# Patient Record
Sex: Female | Born: 1945 | Race: White | Hispanic: No | Marital: Single
Health system: Southern US, Community
[De-identification: ages and names within clinical notes are randomized; demographics above are authoritative.]

---

## 2005-08-04 ENCOUNTER — Ambulatory Visit: Payer: Self-pay | Admitting: *Deleted

## 2006-05-05 ENCOUNTER — Ambulatory Visit: Payer: Self-pay

## 2007-03-25 ENCOUNTER — Ambulatory Visit: Payer: Self-pay | Admitting: *Deleted

## 2007-04-19 ENCOUNTER — Ambulatory Visit: Payer: Self-pay | Admitting: *Deleted

## 2008-03-13 ENCOUNTER — Ambulatory Visit: Payer: Self-pay | Admitting: *Deleted

## 2008-04-11 ENCOUNTER — Ambulatory Visit: Payer: Self-pay | Admitting: Family Medicine

## 2008-05-09 ENCOUNTER — Ambulatory Visit: Payer: Self-pay | Admitting: Oncology

## 2008-05-17 ENCOUNTER — Ambulatory Visit: Payer: Self-pay | Admitting: Oncology

## 2008-06-17 ENCOUNTER — Ambulatory Visit: Payer: Self-pay | Admitting: Oncology

## 2008-08-14 ENCOUNTER — Ambulatory Visit: Payer: Self-pay | Admitting: Oncology

## 2008-08-17 ENCOUNTER — Ambulatory Visit: Payer: Self-pay | Admitting: Oncology

## 2008-08-22 ENCOUNTER — Ambulatory Visit: Payer: Self-pay | Admitting: Oncology

## 2008-09-03 ENCOUNTER — Ambulatory Visit: Payer: Self-pay | Admitting: General Surgery

## 2008-09-06 ENCOUNTER — Ambulatory Visit: Payer: Self-pay | Admitting: General Surgery

## 2008-09-17 ENCOUNTER — Ambulatory Visit: Payer: Self-pay | Admitting: Oncology

## 2008-10-15 ENCOUNTER — Ambulatory Visit: Payer: Self-pay | Admitting: Oncology

## 2008-12-15 ENCOUNTER — Ambulatory Visit: Payer: Self-pay | Admitting: Oncology

## 2008-12-17 ENCOUNTER — Ambulatory Visit: Payer: Self-pay | Admitting: Oncology

## 2008-12-26 ENCOUNTER — Ambulatory Visit: Payer: Self-pay | Admitting: Oncology

## 2009-01-15 ENCOUNTER — Ambulatory Visit: Payer: Self-pay | Admitting: Oncology

## 2009-03-27 ENCOUNTER — Ambulatory Visit: Payer: Self-pay | Admitting: Oncology

## 2009-04-17 ENCOUNTER — Ambulatory Visit: Payer: Self-pay | Admitting: Oncology

## 2009-06-17 ENCOUNTER — Ambulatory Visit: Payer: Self-pay | Admitting: Oncology

## 2009-06-20 ENCOUNTER — Ambulatory Visit: Payer: Self-pay | Admitting: Oncology

## 2009-07-04 ENCOUNTER — Ambulatory Visit: Payer: Self-pay | Admitting: Oncology

## 2009-07-17 ENCOUNTER — Ambulatory Visit: Payer: Self-pay | Admitting: Oncology

## 2010-01-15 ENCOUNTER — Ambulatory Visit: Payer: Self-pay | Admitting: Oncology

## 2010-01-22 ENCOUNTER — Ambulatory Visit: Payer: Self-pay | Admitting: Oncology

## 2010-02-14 ENCOUNTER — Ambulatory Visit: Payer: Self-pay | Admitting: Oncology

## 2010-07-29 ENCOUNTER — Ambulatory Visit: Payer: Self-pay | Admitting: Oncology

## 2010-07-31 ENCOUNTER — Ambulatory Visit: Payer: Self-pay | Admitting: Oncology

## 2010-08-17 ENCOUNTER — Ambulatory Visit: Payer: Self-pay | Admitting: Oncology

## 2010-10-02 ENCOUNTER — Ambulatory Visit: Payer: Self-pay | Admitting: Oncology

## 2010-10-09 ENCOUNTER — Ambulatory Visit: Payer: Self-pay | Admitting: Vascular Surgery

## 2010-10-16 ENCOUNTER — Ambulatory Visit: Payer: Self-pay | Admitting: Oncology

## 2010-11-16 ENCOUNTER — Ambulatory Visit: Payer: Self-pay | Admitting: Oncology

## 2010-12-16 ENCOUNTER — Ambulatory Visit: Payer: Self-pay | Admitting: Oncology

## 2011-01-16 ENCOUNTER — Ambulatory Visit: Payer: Self-pay | Admitting: Oncology

## 2011-02-15 ENCOUNTER — Ambulatory Visit: Payer: Self-pay | Admitting: Oncology

## 2011-03-18 ENCOUNTER — Ambulatory Visit: Payer: Self-pay | Admitting: Oncology

## 2011-04-18 ENCOUNTER — Ambulatory Visit: Payer: Self-pay | Admitting: Oncology

## 2011-05-18 ENCOUNTER — Ambulatory Visit: Payer: Self-pay | Admitting: Oncology

## 2011-05-18 ENCOUNTER — Ambulatory Visit: Payer: Self-pay

## 2011-06-19 ENCOUNTER — Ambulatory Visit: Payer: Self-pay | Admitting: Oncology

## 2011-07-18 ENCOUNTER — Ambulatory Visit: Payer: Self-pay | Admitting: Oncology

## 2011-08-18 ENCOUNTER — Ambulatory Visit: Payer: Self-pay | Admitting: Oncology

## 2011-09-07 ENCOUNTER — Inpatient Hospital Stay: Payer: Self-pay | Admitting: Internal Medicine

## 2011-09-07 LAB — CBC WITH DIFFERENTIAL/PLATELET
Basophil #: 0 10*3/uL (ref 0.0–0.1)
Basophil %: 0.3 %
Eosinophil #: 0 10*3/uL (ref 0.0–0.7)
HCT: 26.7 % — ABNORMAL LOW (ref 35.0–47.0)
HGB: 8.8 g/dL — ABNORMAL LOW (ref 12.0–16.0)
Lymphocyte %: 35.5 %
MCHC: 33 g/dL (ref 32.0–36.0)
Monocyte #: 0.5 10*3/uL (ref 0.0–0.7)
Neutrophil %: 58.5 %
Platelet: 85 10*3/uL — ABNORMAL LOW (ref 150–440)
RBC: 2.72 10*6/uL — ABNORMAL LOW (ref 3.80–5.20)
RDW: 13.9 % (ref 11.5–14.5)
WBC: 8.6 10*3/uL (ref 3.6–11.0)

## 2011-09-07 LAB — BASIC METABOLIC PANEL
Anion Gap: 15 (ref 7–16)
BUN: 27 mg/dL — ABNORMAL HIGH (ref 7–18)
Calcium, Total: 8.5 mg/dL (ref 8.5–10.1)
Creatinine: 1.8 mg/dL — ABNORMAL HIGH (ref 0.60–1.30)
EGFR (African American): 36 — ABNORMAL LOW
EGFR (Non-African Amer.): 30 — ABNORMAL LOW
Glucose: 121 mg/dL — ABNORMAL HIGH (ref 65–99)
Potassium: 3.6 mmol/L (ref 3.5–5.1)
Sodium: 138 mmol/L (ref 136–145)

## 2011-09-07 LAB — URINALYSIS, COMPLETE
Bilirubin,UR: NEGATIVE
Blood: NEGATIVE
Nitrite: NEGATIVE
Protein: 100
RBC,UR: 2 /HPF (ref 0–5)
Specific Gravity: 1.018 (ref 1.003–1.030)
Squamous Epithelial: 3
WBC UR: 6 /HPF (ref 0–5)

## 2011-09-07 LAB — RAPID INFLUENZA A&B ANTIGENS

## 2011-09-08 LAB — CBC WITH DIFFERENTIAL/PLATELET
Basophil %: 0.3 %
Eosinophil #: 0 10*3/uL (ref 0.0–0.7)
Eosinophil %: 0.6 %
HCT: 23.1 % — ABNORMAL LOW (ref 35.0–47.0)
Lymphocyte #: 2.5 10*3/uL (ref 1.0–3.6)
MCH: 32.6 pg (ref 26.0–34.0)
MCHC: 33.4 g/dL (ref 32.0–36.0)
MCV: 98 fL (ref 80–100)
Monocyte #: 0.4 10*3/uL (ref 0.0–0.7)
Neutrophil %: 58.2 %
Platelet: 73 10*3/uL — ABNORMAL LOW (ref 150–440)
RBC: 2.36 10*6/uL — ABNORMAL LOW (ref 3.80–5.20)
RDW: 13.8 % (ref 11.5–14.5)

## 2011-09-08 LAB — COMPREHENSIVE METABOLIC PANEL
Albumin: 2.1 g/dL — ABNORMAL LOW (ref 3.4–5.0)
Alkaline Phosphatase: 50 U/L (ref 50–136)
Anion Gap: 14 (ref 7–16)
Calcium, Total: 7.9 mg/dL — ABNORMAL LOW (ref 8.5–10.1)
Co2: 24 mmol/L (ref 21–32)
Creatinine: 1.46 mg/dL — ABNORMAL HIGH (ref 0.60–1.30)
EGFR (Non-African Amer.): 38 — ABNORMAL LOW
Glucose: 111 mg/dL — ABNORMAL HIGH (ref 65–99)
Osmolality: 281 (ref 275–301)
SGOT(AST): 60 U/L — ABNORMAL HIGH (ref 15–37)
SGPT (ALT): 36 U/L

## 2011-09-08 LAB — MAGNESIUM: Magnesium: 1.6 mg/dL — ABNORMAL LOW

## 2011-09-09 DIAGNOSIS — I369 Nonrheumatic tricuspid valve disorder, unspecified: Secondary | ICD-10-CM

## 2011-09-09 LAB — CBC WITH DIFFERENTIAL/PLATELET
Basophil %: 0.2 %
Eosinophil %: 0.6 %
HCT: 26.3 % — ABNORMAL LOW (ref 35.0–47.0)
HGB: 8.7 g/dL — ABNORMAL LOW (ref 12.0–16.0)
Lymphocyte #: 2.1 10*3/uL (ref 1.0–3.6)
Lymphocyte %: 27.6 %
MCHC: 33 g/dL (ref 32.0–36.0)
MCV: 99 fL (ref 80–100)
Monocyte %: 4.3 %
Neutrophil #: 5.1 10*3/uL (ref 1.4–6.5)
RBC: 2.67 10*6/uL — ABNORMAL LOW (ref 3.80–5.20)
WBC: 7.5 10*3/uL (ref 3.6–11.0)

## 2011-09-09 LAB — BASIC METABOLIC PANEL
BUN: 14 mg/dL (ref 7–18)
Calcium, Total: 8.2 mg/dL — ABNORMAL LOW (ref 8.5–10.1)
Chloride: 104 mmol/L (ref 98–107)
Creatinine: 1.02 mg/dL (ref 0.60–1.30)
EGFR (African American): >60
EGFR (Non-African Amer.): 58 — ABNORMAL LOW
Glucose: 184 mg/dL — ABNORMAL HIGH (ref 65–99)
Sodium: 140 mmol/L (ref 136–145)

## 2011-09-10 LAB — BASIC METABOLIC PANEL
Anion Gap: 12 (ref 7–16)
BUN: 14 mg/dL (ref 7–18)
Calcium, Total: 8.6 mg/dL (ref 8.5–10.1)
Creatinine: 0.98 mg/dL (ref 0.60–1.30)
EGFR (African American): >60
Glucose: 241 mg/dL — ABNORMAL HIGH (ref 65–99)
Potassium: 3.7 mmol/L (ref 3.5–5.1)
Sodium: 141 mmol/L (ref 136–145)

## 2011-09-10 LAB — CBC WITH DIFFERENTIAL/PLATELET
Basophil #: 0 10*3/uL (ref 0.0–0.1)
Eosinophil %: 0 %
HCT: 25.5 % — ABNORMAL LOW (ref 35.0–47.0)
Lymphocyte #: 2 10*3/uL (ref 1.0–3.6)
MCH: 32.7 pg (ref 26.0–34.0)
MCV: 99 fL (ref 80–100)
Monocyte %: 2.1 %
Neutrophil #: 4.2 10*3/uL (ref 1.4–6.5)
Platelet: 62 10*3/uL — ABNORMAL LOW (ref 150–440)
RBC: 2.58 10*6/uL — ABNORMAL LOW (ref 3.80–5.20)
RDW: 14 % (ref 11.5–14.5)
WBC: 6.3 10*3/uL (ref 3.6–11.0)

## 2011-09-11 LAB — BASIC METABOLIC PANEL
Anion Gap: 12 (ref 7–16)
BUN: 24 mg/dL — ABNORMAL HIGH (ref 7–18)
Calcium, Total: 7.7 mg/dL — ABNORMAL LOW (ref 8.5–10.1)
Chloride: 104 mmol/L (ref 98–107)
Creatinine: 1.25 mg/dL (ref 0.60–1.30)
EGFR (African American): 55 — ABNORMAL LOW
EGFR (Non-African Amer.): 46 — ABNORMAL LOW

## 2011-09-11 LAB — CBC WITH DIFFERENTIAL/PLATELET
Basophil %: 0.2 %
Eosinophil #: 0 10*3/uL (ref 0.0–0.7)
HCT: 26 % — ABNORMAL LOW (ref 35.0–47.0)
HGB: 8.7 g/dL — ABNORMAL LOW (ref 12.0–16.0)
Lymphocyte #: 2.2 10*3/uL (ref 1.0–3.6)
Lymphocyte %: 21.7 %
MCHC: 33.4 g/dL (ref 32.0–36.0)
MCV: 98 fL (ref 80–100)
Monocyte #: 0.1 10*3/uL (ref 0.0–0.7)
Monocyte %: 1.3 %
Neutrophil #: 7.9 10*3/uL — ABNORMAL HIGH (ref 1.4–6.5)
Neutrophil %: 76.8 %
WBC: 10.3 10*3/uL (ref 3.6–11.0)

## 2011-09-12 LAB — CBC WITH DIFFERENTIAL/PLATELET
Basophil #: 0 10*3/uL (ref 0.0–0.1)
Eosinophil %: 0 %
HGB: 8.5 g/dL — ABNORMAL LOW (ref 12.0–16.0)
Lymphocyte #: 3 10*3/uL (ref 1.0–3.6)
Lymphocyte %: 30.4 %
MCH: 32.7 pg (ref 26.0–34.0)
MCV: 98 fL (ref 80–100)
Monocyte #: 0.2 10*3/uL (ref 0.0–0.7)
Neutrophil %: 67.5 %
Platelet: 70 10*3/uL — ABNORMAL LOW (ref 150–440)
RDW: 14.4 % (ref 11.5–14.5)

## 2011-09-12 LAB — BASIC METABOLIC PANEL
Anion Gap: 12 (ref 7–16)
BUN: 35 mg/dL — ABNORMAL HIGH (ref 7–18)
Calcium, Total: 8.8 mg/dL (ref 8.5–10.1)
Chloride: 107 mmol/L (ref 98–107)
Co2: 26 mmol/L (ref 21–32)
Osmolality: 301 (ref 275–301)
Potassium: 3.3 mmol/L — ABNORMAL LOW (ref 3.5–5.1)

## 2011-09-12 LAB — VANCOMYCIN, TROUGH: Vancomycin, Trough: 27 ug/mL (ref 10–20)

## 2011-09-12 LAB — STOOL CULTURE

## 2011-09-13 LAB — CBC WITH DIFFERENTIAL/PLATELET
Basophil #: 0 10*3/uL (ref 0.0–0.1)
Basophil %: 0.1 %
Eosinophil #: 0 10*3/uL (ref 0.0–0.7)
Eosinophil %: 0 %
HCT: 26.2 % — ABNORMAL LOW (ref 35.0–47.0)
HGB: 8.6 g/dL — ABNORMAL LOW (ref 12.0–16.0)
Lymphocyte #: 3.4 10*3/uL (ref 1.0–3.6)
MCH: 32.7 pg (ref 26.0–34.0)
MCHC: 33 g/dL (ref 32.0–36.0)
MCV: 99 fL (ref 80–100)
Monocyte #: 0.3 10*3/uL (ref 0.0–0.7)
Neutrophil #: 4.7 10*3/uL (ref 1.4–6.5)
RDW: 14.8 % — ABNORMAL HIGH (ref 11.5–14.5)

## 2011-09-13 LAB — CREATININE, SERUM
Creatinine: 1.69 mg/dL — ABNORMAL HIGH (ref 0.60–1.30)
EGFR (African American): 39 — ABNORMAL LOW
EGFR (Non-African Amer.): 32 — ABNORMAL LOW

## 2011-09-13 LAB — CULTURE, BLOOD (SINGLE)

## 2011-09-14 LAB — BASIC METABOLIC PANEL
BUN: 34 mg/dL — ABNORMAL HIGH (ref 7–18)
Chloride: 109 mmol/L — ABNORMAL HIGH (ref 98–107)
EGFR (African American): 42 — ABNORMAL LOW
EGFR (Non-African Amer.): 35 — ABNORMAL LOW
Glucose: 243 mg/dL — ABNORMAL HIGH (ref 65–99)
Osmolality: 304 (ref 275–301)
Potassium: 4.4 mmol/L (ref 3.5–5.1)
Sodium: 145 mmol/L (ref 136–145)

## 2011-09-14 LAB — CBC WITH DIFFERENTIAL/PLATELET
Basophil #: 0 10*3/uL (ref 0.0–0.1)
Basophil %: 0.2 %
Eosinophil %: 0 %
Lymphocyte #: 3.9 10*3/uL — ABNORMAL HIGH (ref 1.0–3.6)
Lymphocyte %: 48.2 %
Monocyte %: 2.9 %
Platelet: 70 10*3/uL — ABNORMAL LOW (ref 150–440)
RDW: 14.5 % (ref 11.5–14.5)
WBC: 8.1 10*3/uL (ref 3.6–11.0)

## 2011-09-15 LAB — CBC WITH DIFFERENTIAL/PLATELET
Basophil #: 0 10*3/uL (ref 0.0–0.1)
Eosinophil #: 0 10*3/uL (ref 0.0–0.7)
Eosinophil %: 0 %
HCT: 26.4 % — ABNORMAL LOW (ref 35.0–47.0)
Lymphocyte %: 44.3 %
MCH: 33.2 pg (ref 26.0–34.0)
Monocyte #: 0.2 10*3/uL (ref 0.0–0.7)
Neutrophil %: 52.1 %
Platelet: 70 10*3/uL — ABNORMAL LOW (ref 150–440)
RBC: 2.65 10*6/uL — ABNORMAL LOW (ref 3.80–5.20)

## 2011-09-15 LAB — BASIC METABOLIC PANEL
BUN: 35 mg/dL — ABNORMAL HIGH (ref 7–18)
Calcium, Total: 8.4 mg/dL — ABNORMAL LOW (ref 8.5–10.1)
EGFR (Non-African Amer.): 31 — ABNORMAL LOW
Glucose: 231 mg/dL — ABNORMAL HIGH (ref 65–99)
Osmolality: 302 (ref 275–301)

## 2011-09-16 LAB — BASIC METABOLIC PANEL
Anion Gap: 11 (ref 7–16)
Calcium, Total: 8.7 mg/dL (ref 8.5–10.1)
Co2: 27 mmol/L (ref 21–32)
EGFR (African American): 45 — ABNORMAL LOW
EGFR (Non-African Amer.): 37 — ABNORMAL LOW
Glucose: 113 mg/dL — ABNORMAL HIGH (ref 65–99)
Osmolality: 298 (ref 275–301)

## 2011-09-16 LAB — VANCOMYCIN, TROUGH: Vancomycin, Trough: 15 ug/mL (ref 10–20)

## 2011-09-16 LAB — KOH PREP

## 2011-09-17 LAB — BASIC METABOLIC PANEL
Anion Gap: 9 (ref 7–16)
BUN: 28 mg/dL — ABNORMAL HIGH (ref 7–18)
Calcium, Total: 8.7 mg/dL (ref 8.5–10.1)
EGFR (African American): 47 — ABNORMAL LOW
EGFR (Non-African Amer.): 39 — ABNORMAL LOW
Glucose: 135 mg/dL — ABNORMAL HIGH (ref 65–99)
Osmolality: 294 (ref 275–301)

## 2011-09-18 ENCOUNTER — Ambulatory Visit: Payer: Self-pay | Admitting: Oncology

## 2011-09-27 LAB — COMPREHENSIVE METABOLIC PANEL
Albumin: 2.7 g/dL — ABNORMAL LOW (ref 3.4–5.0)
Alkaline Phosphatase: 65 U/L (ref 50–136)
Anion Gap: 13 (ref 7–16)
BUN: 21 mg/dL — ABNORMAL HIGH (ref 7–18)
Bilirubin,Total: 0.7 mg/dL (ref 0.2–1.0)
Calcium, Total: 8.1 mg/dL — ABNORMAL LOW (ref 8.5–10.1)
Co2: 24 mmol/L (ref 21–32)
Creatinine: 1.91 mg/dL — ABNORMAL HIGH (ref 0.60–1.30)
Glucose: 200 mg/dL — ABNORMAL HIGH (ref 65–99)
Osmolality: 275 (ref 275–301)
Potassium: 3.8 mmol/L (ref 3.5–5.1)
SGPT (ALT): 24 U/L
Sodium: 133 mmol/L — ABNORMAL LOW (ref 136–145)
Total Protein: 6.6 g/dL (ref 6.4–8.2)

## 2011-09-27 LAB — CBC
HCT: 26.9 % — ABNORMAL LOW (ref 35.0–47.0)
HGB: 9.1 g/dL — ABNORMAL LOW (ref 12.0–16.0)
MCV: 98 fL (ref 80–100)
RBC: 2.76 10*6/uL — ABNORMAL LOW (ref 3.80–5.20)
RDW: 15.7 % — ABNORMAL HIGH (ref 11.5–14.5)
WBC: 9.6 10*3/uL (ref 3.6–11.0)

## 2011-09-28 ENCOUNTER — Inpatient Hospital Stay: Payer: Self-pay | Admitting: Internal Medicine

## 2011-09-28 DIAGNOSIS — R748 Abnormal levels of other serum enzymes: Secondary | ICD-10-CM

## 2011-09-28 LAB — CK TOTAL AND CKMB (NOT AT ARMC)
CK, Total: 25 U/L (ref 21–215)
CK, Total: 45 U/L (ref 21–215)
CK, Total: 44 U/L (ref 21–215)
CK-MB: 3.1 ng/mL (ref 0.5–3.6)
CK-MB: 3.2 ng/mL (ref 0.5–3.6)

## 2011-09-28 LAB — URINALYSIS, COMPLETE
Glucose,UR: NEGATIVE mg/dL (ref 0–75)
Granular Cast: 9
Hyaline Cast: 5
Leukocyte Esterase: NEGATIVE
Nitrite: NEGATIVE
Ph: 5 (ref 4.5–8.0)
Protein: 100

## 2011-09-28 LAB — TROPONIN I: Troponin-I: 0.03 ng/mL

## 2011-09-28 LAB — RAPID INFLUENZA A&B ANTIGENS

## 2011-09-29 LAB — CBC WITH DIFFERENTIAL/PLATELET
Eosinophil %: 0 %
HCT: 22.6 % — ABNORMAL LOW (ref 35.0–47.0)
HGB: 7.6 g/dL — ABNORMAL LOW (ref 12.0–16.0)
Lymphocyte #: 2.1 10*3/uL (ref 1.0–3.6)
Lymphocyte %: 40.5 %
MCHC: 33.8 g/dL (ref 32.0–36.0)
MCV: 97 fL (ref 80–100)
Monocyte #: 0.1 10*3/uL (ref 0.0–0.7)
Monocyte %: 2.5 %
Neutrophil %: 56.7 %
Platelet: 50 10*3/uL — ABNORMAL LOW (ref 150–440)
RBC: 2.33 10*6/uL — ABNORMAL LOW (ref 3.80–5.20)
WBC: 5.2 10*3/uL (ref 3.6–11.0)

## 2011-09-29 LAB — BASIC METABOLIC PANEL
Anion Gap: 12 (ref 7–16)
BUN: 27 mg/dL — ABNORMAL HIGH (ref 7–18)
Chloride: 97 mmol/L — ABNORMAL LOW (ref 98–107)
Creatinine: 1.66 mg/dL — ABNORMAL HIGH (ref 0.60–1.30)
EGFR (African American): 40 — ABNORMAL LOW
Glucose: 274 mg/dL — ABNORMAL HIGH (ref 65–99)
Potassium: 3.3 mmol/L — ABNORMAL LOW (ref 3.5–5.1)
Sodium: 135 mmol/L — ABNORMAL LOW (ref 136–145)

## 2011-09-30 LAB — BASIC METABOLIC PANEL
Anion Gap: 9 (ref 7–16)
BUN: 31 mg/dL — ABNORMAL HIGH (ref 7–18)
Chloride: 102 mmol/L (ref 98–107)
Co2: 27 mmol/L (ref 21–32)
Creatinine: 1.6 mg/dL — ABNORMAL HIGH (ref 0.60–1.30)
EGFR (African American): 42 — ABNORMAL LOW
EGFR (Non-African Amer.): 34 — ABNORMAL LOW
Glucose: 175 mg/dL — ABNORMAL HIGH (ref 65–99)

## 2011-09-30 LAB — CBC WITH DIFFERENTIAL/PLATELET
Basophil #: 0 10*3/uL (ref 0.0–0.1)
Basophil %: 0.3 %
Eosinophil #: 0 10*3/uL (ref 0.0–0.7)
HCT: 27.7 % — ABNORMAL LOW (ref 35.0–47.0)
HGB: 9.5 g/dL — ABNORMAL LOW (ref 12.0–16.0)
Lymphocyte %: 48.9 %
MCH: 32.6 pg (ref 26.0–34.0)
MCHC: 34.1 g/dL (ref 32.0–36.0)
MCV: 96 fL (ref 80–100)
Monocyte #: 0.3 10*3/uL (ref 0.0–0.7)
Neutrophil #: 3 10*3/uL (ref 1.4–6.5)
Neutrophil %: 45.3 %
RBC: 2.9 10*6/uL — ABNORMAL LOW (ref 3.80–5.20)

## 2011-09-30 LAB — URINE CULTURE

## 2011-10-01 ENCOUNTER — Ambulatory Visit: Payer: Self-pay | Admitting: Oncology

## 2011-10-03 LAB — CULTURE, BLOOD (SINGLE)

## 2011-10-08 LAB — CBC CANCER CENTER
Basophil #: 0 x10 3/mm (ref 0.0–0.1)
Eosinophil #: 0 x10 3/mm (ref 0.0–0.7)
HCT: 32.4 % — ABNORMAL LOW (ref 35.0–47.0)
Lymphocyte %: 69.3 %
MCH: 32.5 pg (ref 26.0–34.0)
MCHC: 34 g/dL (ref 32.0–36.0)
Monocyte #: 0.5 x10 3/mm (ref 0.0–0.7)
Monocyte %: 5.7 %
Neutrophil #: 2 x10 3/mm (ref 1.4–6.5)
Neutrophil %: 24.2 %
Platelet: 44 x10 3/mm — ABNORMAL LOW (ref 150–440)
RDW: 16.5 % — ABNORMAL HIGH (ref 11.5–14.5)

## 2011-10-08 LAB — COMPREHENSIVE METABOLIC PANEL
Albumin: 2.8 g/dL — ABNORMAL LOW (ref 3.4–5.0)
Alkaline Phosphatase: 113 U/L (ref 50–136)
Anion Gap: 8 (ref 7–16)
BUN: 16 mg/dL (ref 7–18)
Bilirubin,Total: 0.3 mg/dL (ref 0.2–1.0)
Co2: 28 mmol/L (ref 21–32)
Creatinine: 1.53 mg/dL — ABNORMAL HIGH (ref 0.60–1.30)
EGFR (Non-African Amer.): 36 — ABNORMAL LOW
Glucose: 189 mg/dL — ABNORMAL HIGH (ref 65–99)
Osmolality: 284 (ref 275–301)
SGPT (ALT): 31 U/L
Sodium: 139 mmol/L (ref 136–145)
Total Protein: 6.3 g/dL — ABNORMAL LOW (ref 6.4–8.2)

## 2011-10-15 LAB — CBC CANCER CENTER
Basophil #: 0 x10 3/mm (ref 0.0–0.1)
Basophil %: 0.3 %
Eosinophil #: 0.1 x10 3/mm (ref 0.0–0.7)
HCT: 32.1 % — ABNORMAL LOW (ref 35.0–47.0)
HGB: 10.8 g/dL — ABNORMAL LOW (ref 12.0–16.0)
Lymphocyte #: 8.7 x10 3/mm — ABNORMAL HIGH (ref 1.0–3.6)
MCH: 32 pg (ref 26.0–34.0)
MCHC: 33.6 g/dL (ref 32.0–36.0)
Neutrophil #: 1.9 x10 3/mm (ref 1.4–6.5)
Neutrophil %: 16.7 %
Platelet: 90 x10 3/mm — ABNORMAL LOW (ref 150–440)

## 2011-10-15 LAB — BASIC METABOLIC PANEL
BUN: 20 mg/dL — ABNORMAL HIGH (ref 7–18)
Chloride: 103 mmol/L (ref 98–107)
Creatinine: 1.46 mg/dL — ABNORMAL HIGH (ref 0.60–1.30)
EGFR (African American): 46 — ABNORMAL LOW
EGFR (Non-African Amer.): 38 — ABNORMAL LOW
Glucose: 167 mg/dL — ABNORMAL HIGH (ref 65–99)
Osmolality: 288 (ref 275–301)
Potassium: 4.1 mmol/L (ref 3.5–5.1)
Sodium: 141 mmol/L (ref 136–145)

## 2011-10-16 ENCOUNTER — Ambulatory Visit: Payer: Self-pay | Admitting: Oncology

## 2011-10-22 LAB — BASIC METABOLIC PANEL
Calcium, Total: 8.6 mg/dL (ref 8.5–10.1)
Chloride: 102 mmol/L (ref 98–107)
Creatinine: 1.52 mg/dL — ABNORMAL HIGH (ref 0.60–1.30)
EGFR (African American): 44 — ABNORMAL LOW
EGFR (Non-African Amer.): 36 — ABNORMAL LOW
Glucose: 199 mg/dL — ABNORMAL HIGH (ref 65–99)
Potassium: 4.2 mmol/L (ref 3.5–5.1)
Sodium: 139 mmol/L (ref 136–145)

## 2011-10-22 LAB — CBC CANCER CENTER
Basophil %: 0.3 %
Eosinophil #: 0.2 x10 3/mm (ref 0.0–0.7)
Eosinophil %: 1.7 %
HGB: 11.1 g/dL — ABNORMAL LOW (ref 12.0–16.0)
MCH: 32.7 pg (ref 26.0–34.0)
MCHC: 34.2 g/dL (ref 32.0–36.0)
MCV: 96 fL (ref 80–100)
Monocyte #: 0.7 x10 3/mm (ref 0.0–0.7)
Monocyte %: 6.8 %
Neutrophil %: 26.2 %
Platelet: 105 x10 3/mm — ABNORMAL LOW (ref 150–440)

## 2011-10-29 ENCOUNTER — Emergency Department: Payer: Self-pay | Admitting: Emergency Medicine

## 2011-10-29 LAB — BASIC METABOLIC PANEL
Anion Gap: 12 (ref 7–16)
BUN: 16 mg/dL (ref 7–18)
Chloride: 104 mmol/L (ref 98–107)
Co2: 26 mmol/L (ref 21–32)
Creatinine: 1.33 mg/dL — ABNORMAL HIGH (ref 0.60–1.30)
Glucose: 216 mg/dL — ABNORMAL HIGH (ref 65–99)
Potassium: 3.9 mmol/L (ref 3.5–5.1)

## 2011-10-29 LAB — CBC
HGB: 10.1 g/dL — ABNORMAL LOW (ref 12.0–16.0)
MCH: 32.6 pg (ref 26.0–34.0)
MCV: 96 fL (ref 80–100)
RBC: 3.09 10*6/uL — ABNORMAL LOW (ref 3.80–5.20)

## 2011-10-29 LAB — URINALYSIS, COMPLETE
Glucose,UR: >=500 mg/dL (ref 0–75)
Ketone: NEGATIVE
Leukocyte Esterase: NEGATIVE
Nitrite: NEGATIVE
Protein: NEGATIVE
Specific Gravity: 1.013 (ref 1.003–1.030)
WBC UR: 2 /HPF (ref 0–5)

## 2011-10-29 LAB — COMPREHENSIVE METABOLIC PANEL
Albumin: 3.2 g/dL — ABNORMAL LOW (ref 3.4–5.0)
Alkaline Phosphatase: 83 U/L (ref 50–136)
Calcium, Total: 8.4 mg/dL — ABNORMAL LOW (ref 8.5–10.1)
Chloride: 105 mmol/L (ref 98–107)
Creatinine: 1.08 mg/dL (ref 0.60–1.30)
EGFR (Non-African Amer.): 54 — ABNORMAL LOW
Glucose: 245 mg/dL — ABNORMAL HIGH (ref 65–99)
Osmolality: 289 (ref 275–301)
Potassium: 3.9 mmol/L (ref 3.5–5.1)
Sodium: 140 mmol/L (ref 136–145)

## 2011-10-29 LAB — CBC CANCER CENTER
Basophil %: 0.4 %
Eosinophil #: 0.1 x10 3/mm (ref 0.0–0.7)
Eosinophil %: 1.6 %
HCT: 31.1 % — ABNORMAL LOW (ref 35.0–47.0)
HGB: 10.4 g/dL — ABNORMAL LOW (ref 12.0–16.0)
Lymphocyte #: 5.2 x10 3/mm — ABNORMAL HIGH (ref 1.0–3.6)
MCH: 32.1 pg (ref 26.0–34.0)
MCV: 96 fL (ref 80–100)
Monocyte #: 0.5 x10 3/mm (ref 0.0–0.7)
Neutrophil #: 2.8 x10 3/mm (ref 1.4–6.5)
Neutrophil %: 32.4 %
RBC: 3.24 10*6/uL — ABNORMAL LOW (ref 3.80–5.20)

## 2011-11-16 ENCOUNTER — Ambulatory Visit: Payer: Self-pay | Admitting: Oncology

## 2011-11-26 LAB — COMPREHENSIVE METABOLIC PANEL
Alkaline Phosphatase: 98 U/L (ref 50–136)
Chloride: 106 mmol/L (ref 98–107)
EGFR (Non-African Amer.): >60
Glucose: 156 mg/dL — ABNORMAL HIGH (ref 65–99)
Potassium: 3.4 mmol/L — ABNORMAL LOW (ref 3.5–5.1)
SGOT(AST): 20 U/L (ref 15–37)
Sodium: 143 mmol/L (ref 136–145)
Total Protein: 6.6 g/dL (ref 6.4–8.2)

## 2011-11-26 LAB — CBC CANCER CENTER
Eosinophil %: 1.1 %
MCV: 98 fL (ref 80–100)
Monocyte #: 0.5 x10 3/mm (ref 0.2–0.9)
Monocyte %: 5.7 %
Neutrophil #: 2.1 x10 3/mm (ref 1.4–6.5)
Neutrophil %: 26.1 %
RDW: 17.4 % — ABNORMAL HIGH (ref 11.5–14.5)

## 2011-12-16 ENCOUNTER — Ambulatory Visit: Payer: Self-pay | Admitting: Oncology

## 2012-02-23 ENCOUNTER — Ambulatory Visit: Payer: Self-pay | Admitting: Oncology

## 2012-02-23 LAB — COMPREHENSIVE METABOLIC PANEL
Albumin: 3.3 g/dL — ABNORMAL LOW (ref 3.4–5.0)
Alkaline Phosphatase: 123 U/L (ref 50–136)
Bilirubin,Total: 0.2 mg/dL (ref 0.2–1.0)
Calcium, Total: 8.5 mg/dL (ref 8.5–10.1)
Creatinine: 1.04 mg/dL (ref 0.60–1.30)
Glucose: 140 mg/dL — ABNORMAL HIGH (ref 65–99)
Osmolality: 288 (ref 275–301)
Sodium: 143 mmol/L (ref 136–145)

## 2012-02-23 LAB — CBC CANCER CENTER
Eosinophil: 1 %
HCT: 32.3 % — ABNORMAL LOW (ref 35.0–47.0)
HGB: 10.6 g/dL — ABNORMAL LOW (ref 12.0–16.0)
Lymphocytes: 78 %
MCHC: 32.7 g/dL (ref 32.0–36.0)
MCV: 99 fL (ref 80–100)
Monocytes: 5 %
Segmented Neutrophils: 6 %
Variant Lymphocyte: 10 %

## 2012-02-25 ENCOUNTER — Ambulatory Visit: Payer: Self-pay | Admitting: Oncology

## 2012-03-08 LAB — CBC CANCER CENTER
Basophil #: 0 x10 3/mm (ref 0.0–0.1)
Basophil %: 0.1 %
HCT: 27.9 % — ABNORMAL LOW (ref 35.0–47.0)
HGB: 9.1 g/dL — ABNORMAL LOW (ref 12.0–16.0)
Lymphocyte #: 3.2 x10 3/mm (ref 1.0–3.6)
Lymphocyte %: 67.3 %
MCH: 32.2 pg (ref 26.0–34.0)
Monocyte %: 4.8 %
Platelet: 100 x10 3/mm — ABNORMAL LOW (ref 150–440)
RBC: 2.82 10*6/uL — ABNORMAL LOW (ref 3.80–5.20)
RDW: 13.7 % (ref 11.5–14.5)
WBC: 4.7 x10 3/mm (ref 3.6–11.0)

## 2012-03-08 LAB — COMPREHENSIVE METABOLIC PANEL
Albumin: 2.8 g/dL — ABNORMAL LOW (ref 3.4–5.0)
Calcium, Total: 7.9 mg/dL — ABNORMAL LOW (ref 8.5–10.1)
Creatinine: 1.04 mg/dL (ref 0.60–1.30)
Osmolality: 286 (ref 275–301)
Potassium: 3.4 mmol/L — ABNORMAL LOW (ref 3.5–5.1)
SGOT(AST): 21 U/L (ref 15–37)
SGPT (ALT): 19 U/L
Total Protein: 6.1 g/dL — ABNORMAL LOW (ref 6.4–8.2)

## 2012-03-15 LAB — COMPREHENSIVE METABOLIC PANEL
Albumin: 2.8 g/dL — ABNORMAL LOW (ref 3.4–5.0)
Alkaline Phosphatase: 87 U/L (ref 50–136)
Anion Gap: 7 (ref 7–16)
BUN: 10 mg/dL (ref 7–18)
Bilirubin,Total: 0.3 mg/dL (ref 0.2–1.0)
Calcium, Total: 8.1 mg/dL — ABNORMAL LOW (ref 8.5–10.1)
Chloride: 105 mmol/L (ref 98–107)
Co2: 31 mmol/L (ref 21–32)
Creatinine: 1.09 mg/dL (ref 0.60–1.30)
EGFR (African American): >60
EGFR (Non-African Amer.): 53 — ABNORMAL LOW
Glucose: 115 mg/dL — ABNORMAL HIGH (ref 65–99)
Osmolality: 285 (ref 275–301)
Potassium: 3.3 mmol/L — ABNORMAL LOW (ref 3.5–5.1)
SGOT(AST): 16 U/L (ref 15–37)
SGPT (ALT): 14 U/L
Sodium: 143 mmol/L (ref 136–145)
Total Protein: 6 g/dL — ABNORMAL LOW (ref 6.4–8.2)

## 2012-03-15 LAB — CBC CANCER CENTER
Basophil %: 0 %
Eosinophil #: 0 x10 3/mm (ref 0.0–0.7)
HCT: 28.7 % — ABNORMAL LOW (ref 35.0–47.0)
HGB: 9.7 g/dL — ABNORMAL LOW (ref 12.0–16.0)
Lymphocyte #: 2.5 x10 3/mm (ref 1.0–3.6)
Lymphocyte %: 85.4 %
MCH: 33.1 pg (ref 26.0–34.0)
MCHC: 33.8 g/dL (ref 32.0–36.0)
MCV: 98 fL (ref 80–100)
Monocyte #: 0.2 x10 3/mm (ref 0.2–0.9)
Neutrophil #: 0.2 x10 3/mm — ABNORMAL LOW (ref 1.4–6.5)
Neutrophil %: 7.3 %
RBC: 2.94 10*6/uL — ABNORMAL LOW (ref 3.80–5.20)

## 2012-03-17 ENCOUNTER — Ambulatory Visit: Payer: Self-pay | Admitting: Oncology

## 2012-03-22 LAB — COMPREHENSIVE METABOLIC PANEL
Anion Gap: 10 (ref 7–16)
BUN: 10 mg/dL (ref 7–18)
Bilirubin,Total: 0.4 mg/dL (ref 0.2–1.0)
Chloride: 103 mmol/L (ref 98–107)
Creatinine: 1.08 mg/dL (ref 0.60–1.30)
EGFR (African American): >60
EGFR (Non-African Amer.): 53 — ABNORMAL LOW
Glucose: 88 mg/dL (ref 65–99)
Osmolality: 280 (ref 275–301)
Potassium: 3 mmol/L — ABNORMAL LOW (ref 3.5–5.1)
SGOT(AST): 13 U/L — ABNORMAL LOW (ref 15–37)
SGPT (ALT): 14 U/L (ref 12–78)
Sodium: 141 mmol/L (ref 136–145)

## 2012-03-22 LAB — CBC CANCER CENTER
Basophil %: 0.3 %
Eosinophil %: 0.9 %
HGB: 9.1 g/dL — ABNORMAL LOW (ref 12.0–16.0)
Lymphocyte %: 83.1 %
Monocyte %: 7.8 %
Neutrophil %: 7.9 %
RBC: 2.78 10*6/uL — ABNORMAL LOW (ref 3.80–5.20)
WBC: 3.3 x10 3/mm — ABNORMAL LOW (ref 3.6–11.0)

## 2012-04-17 ENCOUNTER — Ambulatory Visit: Payer: Self-pay | Admitting: Oncology

## 2012-04-27 ENCOUNTER — Inpatient Hospital Stay: Payer: Self-pay | Admitting: Oncology

## 2012-04-27 LAB — COMPREHENSIVE METABOLIC PANEL
Albumin: 2.3 g/dL — ABNORMAL LOW (ref 3.4–5.0)
Anion Gap: 8 (ref 7–16)
Calcium, Total: 9 mg/dL (ref 8.5–10.1)
Chloride: 98 mmol/L (ref 98–107)
Co2: 32 mmol/L (ref 21–32)
EGFR (African American): 55 — ABNORMAL LOW
EGFR (Non-African Amer.): 47 — ABNORMAL LOW
Glucose: 131 mg/dL — ABNORMAL HIGH (ref 65–99)
Osmolality: 278 (ref 275–301)
Potassium: 3.1 mmol/L — ABNORMAL LOW (ref 3.5–5.1)
SGOT(AST): 50 U/L — ABNORMAL HIGH (ref 15–37)
Sodium: 138 mmol/L (ref 136–145)
Total Protein: 6.8 g/dL (ref 6.4–8.2)

## 2012-04-27 LAB — CBC CANCER CENTER
Basophil %: 0.2 %
Eosinophil %: 0.2 %
HGB: 8.2 g/dL — ABNORMAL LOW (ref 12.0–16.0)
Lymphocyte #: 6.8 x10 3/mm — ABNORMAL HIGH (ref 1.0–3.6)
MCH: 31.2 pg (ref 26.0–34.0)
MCHC: 32.2 g/dL (ref 32.0–36.0)
MCV: 97 fL (ref 80–100)
Monocyte %: 8.2 %
Neutrophil #: 3.6 x10 3/mm (ref 1.4–6.5)
Platelet: 117 x10 3/mm — ABNORMAL LOW (ref 150–440)
RBC: 2.64 10*6/uL — ABNORMAL LOW (ref 3.80–5.20)
WBC: 11.4 x10 3/mm — ABNORMAL HIGH (ref 3.6–11.0)

## 2012-04-28 LAB — HEMOGLOBIN: HGB: 7.3 g/dL — ABNORMAL LOW (ref 12.0–16.0)

## 2012-04-28 LAB — CBC WITH DIFFERENTIAL/PLATELET
Basophil #: 0 10*3/uL (ref 0.0–0.1)
Basophil %: 0.3 %
Eosinophil %: 0 %
HCT: 19.9 % — ABNORMAL LOW (ref 35.0–47.0)
HGB: 6.4 g/dL — ABNORMAL LOW (ref 12.0–16.0)
Lymphocyte %: 64.9 %
MCH: 31.1 pg (ref 26.0–34.0)
MCHC: 32.3 g/dL (ref 32.0–36.0)
Neutrophil %: 26.9 %
RBC: 2.06 10*6/uL — ABNORMAL LOW (ref 3.80–5.20)

## 2012-04-28 LAB — BASIC METABOLIC PANEL
Calcium, Total: 8.1 mg/dL — ABNORMAL LOW (ref 8.5–10.1)
Chloride: 105 mmol/L (ref 98–107)
Co2: 28 mmol/L (ref 21–32)
EGFR (Non-African Amer.): 55 — ABNORMAL LOW
Osmolality: 281 (ref 275–301)
Potassium: 2.8 mmol/L — ABNORMAL LOW (ref 3.5–5.1)
Sodium: 141 mmol/L (ref 136–145)

## 2012-04-28 LAB — URINALYSIS, COMPLETE
Bilirubin,UR: NEGATIVE
Glucose,UR: NEGATIVE mg/dL (ref 0–75)
Ketone: NEGATIVE
Nitrite: NEGATIVE
Ph: 7 (ref 4.5–8.0)
Protein: NEGATIVE
RBC,UR: <1 /HPF (ref 0–5)
Specific Gravity: 1.004 (ref 1.003–1.030)

## 2012-04-29 LAB — CBC WITH DIFFERENTIAL/PLATELET
HCT: 26.4 % — ABNORMAL LOW (ref 35.0–47.0)
HGB: 8.7 g/dL — ABNORMAL LOW (ref 12.0–16.0)
MCHC: 32.8 g/dL (ref 32.0–36.0)
MCV: 95 fL (ref 80–100)
Platelet: 136 10*3/uL — ABNORMAL LOW (ref 150–440)
RBC: 2.79 10*6/uL — ABNORMAL LOW (ref 3.80–5.20)
RDW: 17.1 % — ABNORMAL HIGH (ref 11.5–14.5)
Segmented Neutrophils: 32 %
WBC: 14.8 10*3/uL — ABNORMAL HIGH (ref 3.6–11.0)

## 2012-04-29 LAB — BASIC METABOLIC PANEL
Anion Gap: 8 (ref 7–16)
BUN: 9 mg/dL (ref 7–18)
Calcium, Total: 8.6 mg/dL (ref 8.5–10.1)
Creatinine: 1.11 mg/dL (ref 0.60–1.30)
EGFR (African American): 60 — ABNORMAL LOW
EGFR (Non-African Amer.): 52 — ABNORMAL LOW
Osmolality: 285 (ref 275–301)
Potassium: 3.5 mmol/L (ref 3.5–5.1)
Sodium: 144 mmol/L (ref 136–145)

## 2012-04-30 LAB — BASIC METABOLIC PANEL
Calcium, Total: 8.1 mg/dL — ABNORMAL LOW (ref 8.5–10.1)
Co2: 27 mmol/L (ref 21–32)
Creatinine: 0.98 mg/dL (ref 0.60–1.30)
Potassium: 3.3 mmol/L — ABNORMAL LOW (ref 3.5–5.1)
Sodium: 145 mmol/L (ref 136–145)

## 2012-04-30 LAB — CBC WITH DIFFERENTIAL/PLATELET
Bands: 1 %
HGB: 7.5 g/dL — ABNORMAL LOW (ref 12.0–16.0)
MCH: 31 pg (ref 26.0–34.0)
MCHC: 32.4 g/dL (ref 32.0–36.0)
Segmented Neutrophils: 48 %
WBC: 11.1 10*3/uL — ABNORMAL HIGH (ref 3.6–11.0)

## 2012-05-01 LAB — CBC WITH DIFFERENTIAL/PLATELET
Bands: 1 %
HCT: 22.1 % — ABNORMAL LOW (ref 35.0–47.0)
HGB: 7.4 g/dL — ABNORMAL LOW (ref 12.0–16.0)
Lymphocytes: 50 %
Monocytes: 9 %
Segmented Neutrophils: 35 %
Variant Lymphocyte - H1-Rlymph: 5 %
WBC: 10.5 10*3/uL (ref 3.6–11.0)

## 2012-05-01 LAB — POTASSIUM: Potassium: 3.1 mmol/L — ABNORMAL LOW (ref 3.5–5.1)

## 2012-05-02 LAB — CBC WITH DIFFERENTIAL/PLATELET
Basophil #: 0 10*3/uL (ref 0.0–0.1)
Eosinophil #: 0 10*3/uL (ref 0.0–0.7)
HCT: 22.4 % — ABNORMAL LOW (ref 35.0–47.0)
Lymphocyte #: 6 10*3/uL — ABNORMAL HIGH (ref 1.0–3.6)
Lymphocyte %: 61 %
MCHC: 33.8 g/dL (ref 32.0–36.0)
Monocyte #: 0.7 x10 3/mm (ref 0.2–0.9)
Monocyte %: 7.2 %
Neutrophil %: 31.7 %
Platelet: 103 10*3/uL — ABNORMAL LOW (ref 150–440)
RDW: 16.8 % — ABNORMAL HIGH (ref 11.5–14.5)
WBC: 9.8 10*3/uL (ref 3.6–11.0)

## 2012-05-03 LAB — CBC WITH DIFFERENTIAL/PLATELET
Basophil #: 0 10*3/uL (ref 0.0–0.1)
Eosinophil %: 0 %
Lymphocyte #: 5.7 10*3/uL — ABNORMAL HIGH (ref 1.0–3.6)
Lymphocyte %: 58.3 %
MCHC: 34.2 g/dL (ref 32.0–36.0)
Monocyte #: 0.6 x10 3/mm (ref 0.2–0.9)
Monocyte %: 6.4 %
Neutrophil #: 3.4 10*3/uL (ref 1.4–6.5)
Neutrophil %: 35.2 %
Platelet: 101 10*3/uL — ABNORMAL LOW (ref 150–440)
RDW: 16.8 % — ABNORMAL HIGH (ref 11.5–14.5)
WBC: 9.8 10*3/uL (ref 3.6–11.0)

## 2012-05-03 LAB — CULTURE, BLOOD (SINGLE)

## 2012-05-03 LAB — BASIC METABOLIC PANEL
Calcium, Total: 8 mg/dL — ABNORMAL LOW (ref 8.5–10.1)
Chloride: 109 mmol/L — ABNORMAL HIGH (ref 98–107)
Co2: 26 mmol/L (ref 21–32)
Creatinine: 0.79 mg/dL (ref 0.60–1.30)
EGFR (African American): >60
Potassium: 2.9 mmol/L — ABNORMAL LOW (ref 3.5–5.1)
Sodium: 142 mmol/L (ref 136–145)

## 2012-05-04 ENCOUNTER — Ambulatory Visit: Payer: Self-pay | Admitting: Oncology

## 2012-05-17 ENCOUNTER — Ambulatory Visit: Payer: Self-pay | Admitting: Oncology

## 2012-06-01 ENCOUNTER — Ambulatory Visit: Payer: Self-pay | Admitting: Oncology

## 2012-06-01 LAB — COMPREHENSIVE METABOLIC PANEL
Albumin: 2.9 g/dL — ABNORMAL LOW (ref 3.4–5.0)
Alkaline Phosphatase: 105 U/L (ref 50–136)
Calcium, Total: 8.7 mg/dL (ref 8.5–10.1)
Creatinine: 1.34 mg/dL — ABNORMAL HIGH (ref 0.60–1.30)
Glucose: 124 mg/dL — ABNORMAL HIGH (ref 65–99)
Osmolality: 281 (ref 275–301)
SGPT (ALT): 20 U/L (ref 12–78)
Sodium: 139 mmol/L (ref 136–145)

## 2012-06-01 LAB — CBC CANCER CENTER
Basophil %: 0.2 %
Eosinophil #: 0.4 x10 3/mm (ref 0.0–0.7)
HCT: 28.7 % — ABNORMAL LOW (ref 35.0–47.0)
HGB: 9.3 g/dL — ABNORMAL LOW (ref 12.0–16.0)
Lymphocyte %: 74.6 %
MCH: 31.5 pg (ref 26.0–34.0)
MCHC: 32.5 g/dL (ref 32.0–36.0)
Neutrophil #: 3.2 x10 3/mm (ref 1.4–6.5)
RBC: 2.96 10*6/uL — ABNORMAL LOW (ref 3.80–5.20)
WBC: 15.3 x10 3/mm — ABNORMAL HIGH (ref 3.6–11.0)

## 2012-06-17 ENCOUNTER — Ambulatory Visit: Payer: Self-pay | Admitting: Oncology

## 2012-06-26 IMAGING — CT CT CHEST-ABD-PELV W/ CM
1 of 2 series · 14 of 29 positions shown, 19 images · IV contrast (isovue)
Comparison: none

REASON FOR EXAM: pt to get IV fluids prior to  CT restaging CLL
COMMENTS:

PROCEDURE:     CT  - CT CHEST ABDOMEN AND PELVIS W  - November 25, 2011 [DATE]
RESULT:     Comparison: CT of the chest, abdomen, and pelvis 09/09/2011.
TECHNIQUE: Multiple axial images obtained from the thoracic inlet to the
pubic symphysis, with p.o. contrast and with 100 ml of Isovue- 300
intravenous contrast.

[Series 2: soft tissue · axial · 0.91mm/px · z∈[-414,+162]mm · 14 of 131 slices shown, 19 images]
[im 8/131  mediastinal]
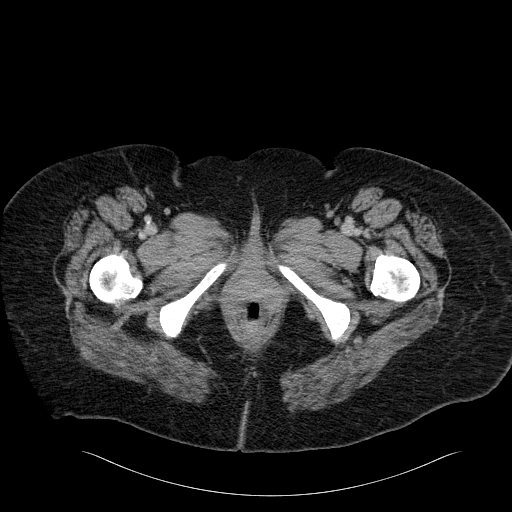
[im 8/131  bone]
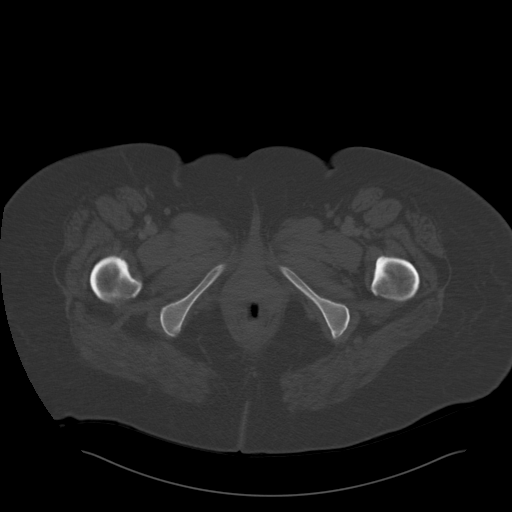
[im 22/131  mediastinal]
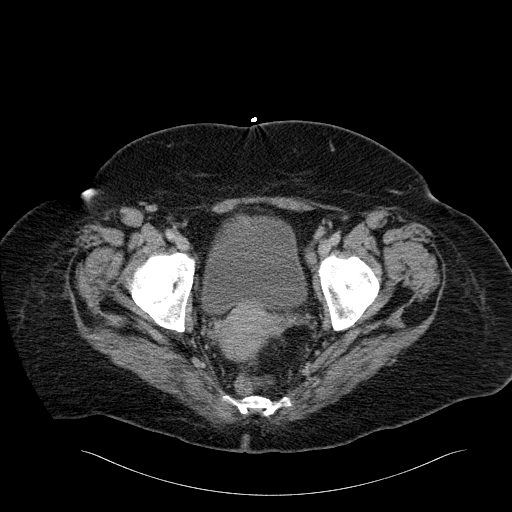
[im 29/131  mediastinal]
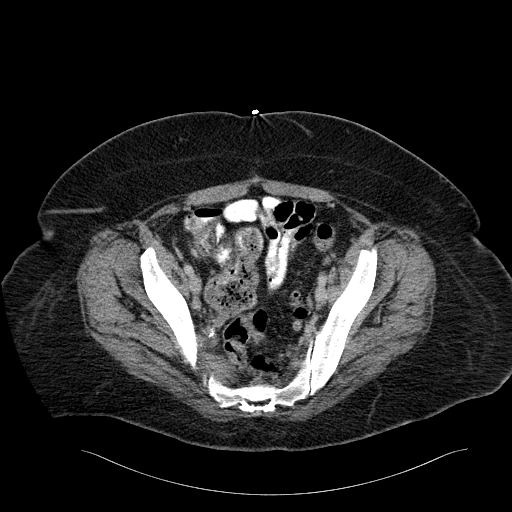
[im 37/131  mediastinal]
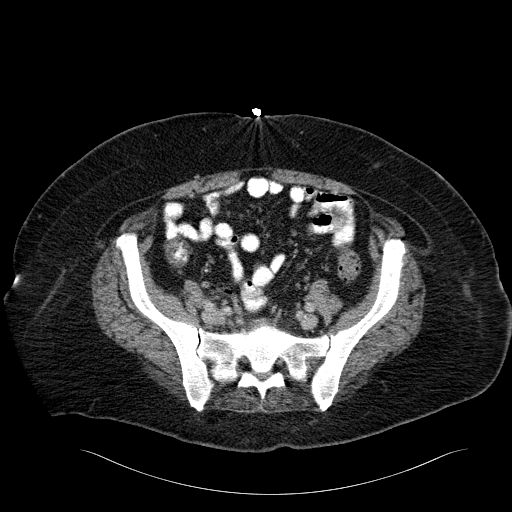
[im 51/131  mediastinal]
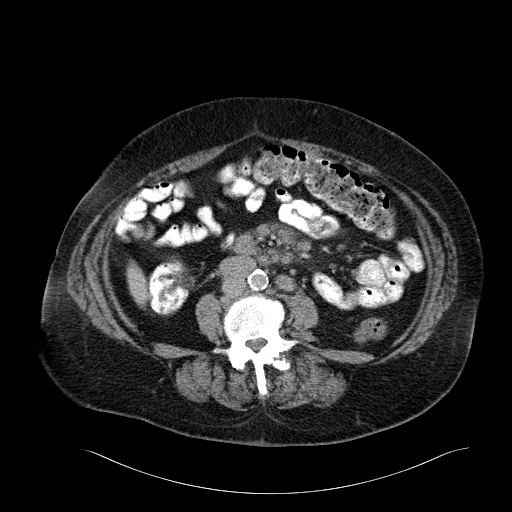
[im 58/131  mediastinal]
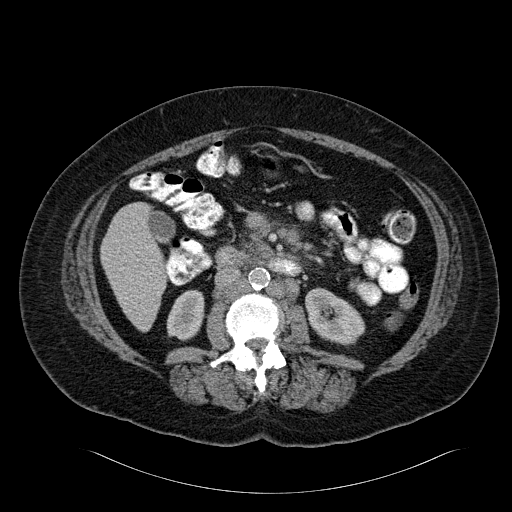
[im 66/131  mediastinal]
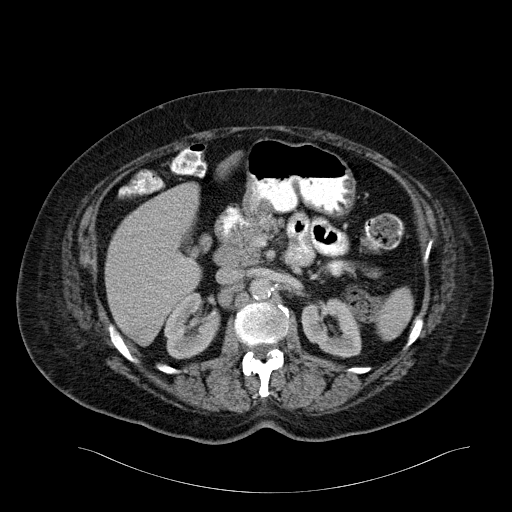
[im 73/131  mediastinal]
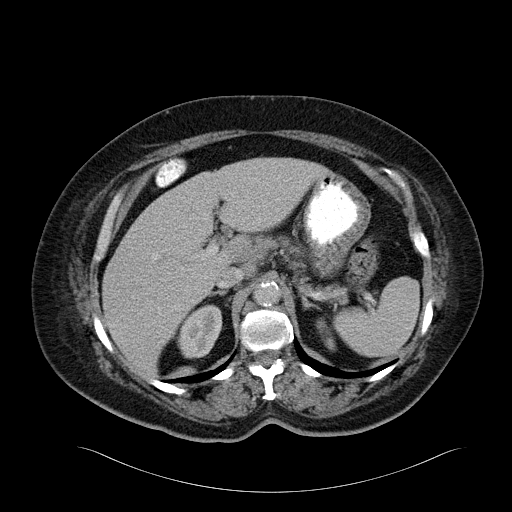
[im 80/131  mediastinal]
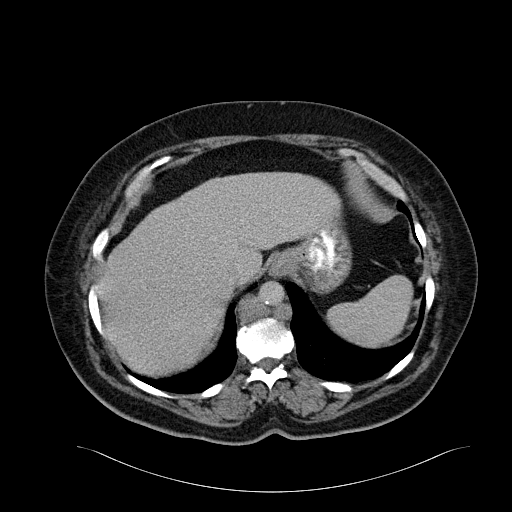
[im 80/131  bone]
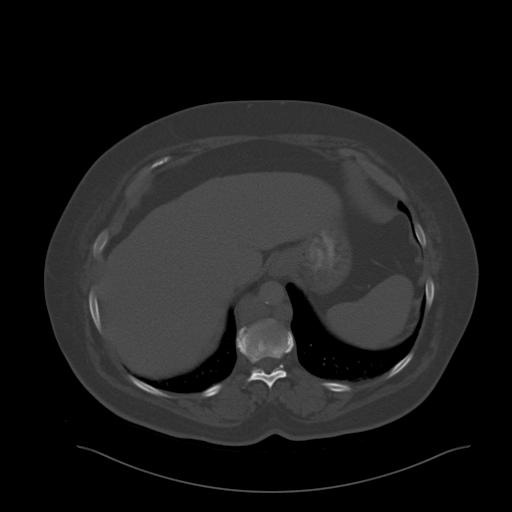
[im 94/131  mediastinal]
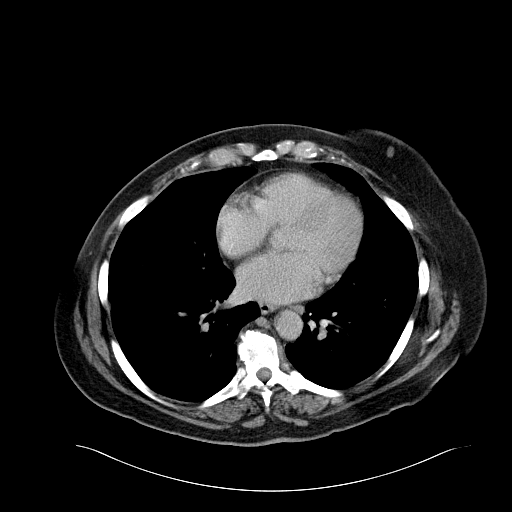
[im 102/131  mediastinal]
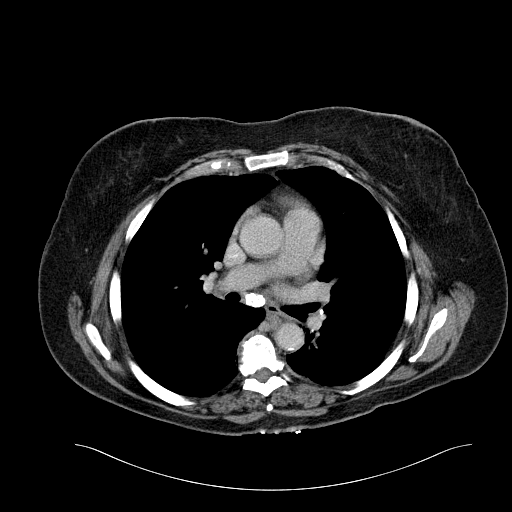
[im 102/131  lung]
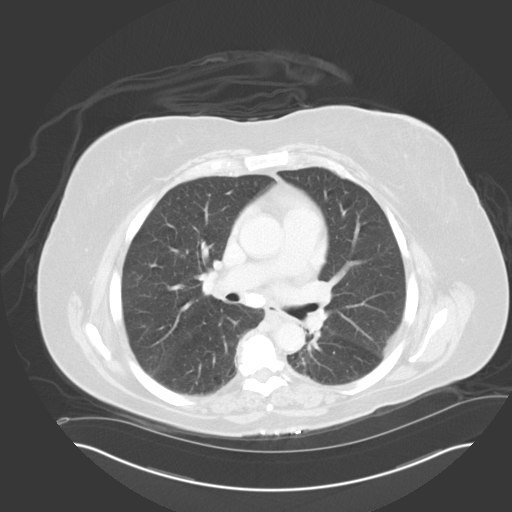
[im 109/131  mediastinal]
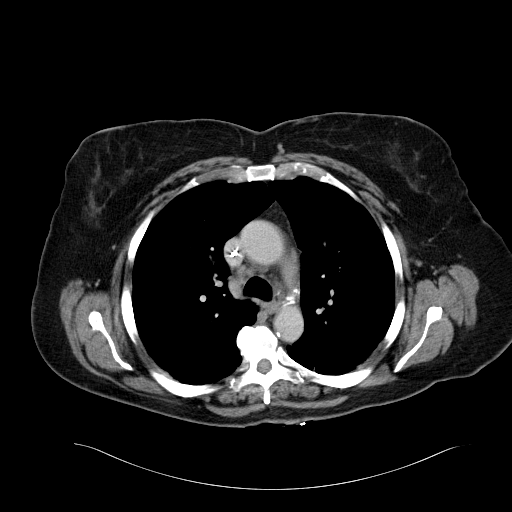
[im 109/131  lung]
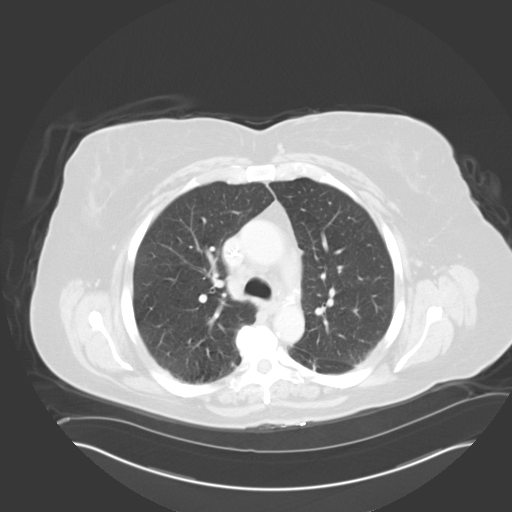
[im 116/131  lung]
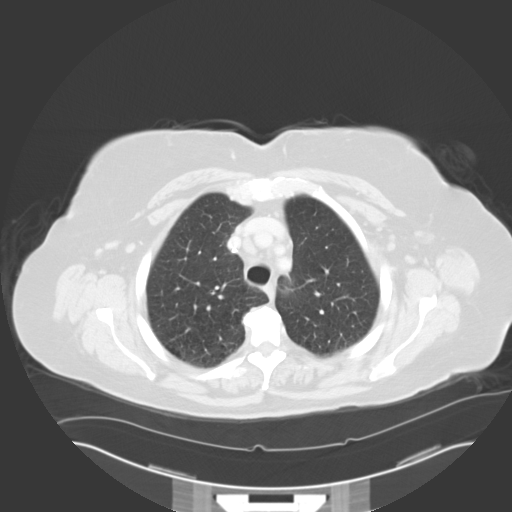
[im 123/131  mediastinal]
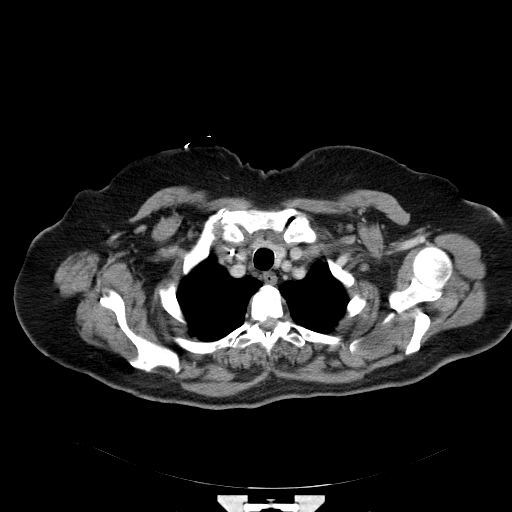
[im 123/131  lung]
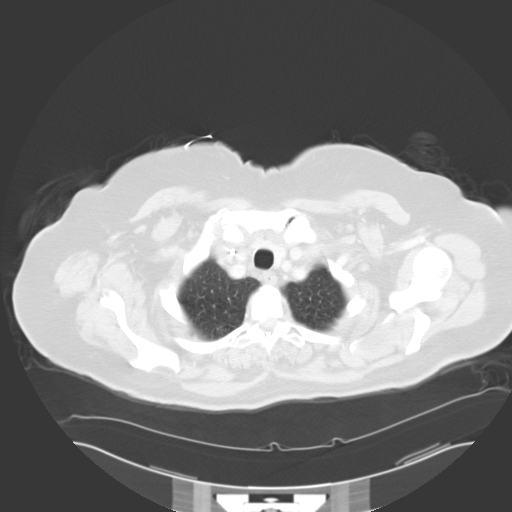

[14 of 29 positions shown; findings below may reference images not displayed]

FINDINGS: Right IJ portacatheter tip terminates at the cavoatrial junction. Mildly
enlarged right paratracheal lymph node measures 1.6 x 1.2 cm, decreased from
prior. There other paratracheal lymph nodes are subcentimeter in size and
decreased from prior. There are calcifications in the subcarinal lymph node.
No hilar lymphadenopathy. There are several mildly prominent axillary lymph
nodes left greater than right, which are similar to prior. There is mild
centrilobular emphysema. There are multiple enlarged periaortic lymph nodes
in the inferior thorax which are similar to prior. The largest conglomerate
measures 2.8 x 2.8 cm.

The liver, spleen, adrenals, and pancreas are unremarkable. There are
gallstones present. There multiple enlarged within the porta hepatis and
retroperitoneum. A representative aortocaval lymph node measures 3.4 x
cm, similar to prior.

The small and large bowel are normal in caliber. There is mild
diverticulosis of the sigmoid colon. The appendix is normal. The kidneys
enhance normally. No hydronephrosis.

No aggressive lytic or sclerotic osseous lesions are identified.
IMPRESSION: Enlarged retroperitoneal, porta hepatis, and inferior mediastinal lymph
nodes are similar to prior. There has been interval decrease in size of the
lymph nodes in the mid and superior mediastinum.

## 2012-07-17 DEATH — deceased

## 2014-12-09 NOTE — Consult Note (Signed)
PATIENT NAME:  Kristi Mckinney, Kristi Mckinney MR#:  [id number] DATE OF BIRTH:  01-Mar-1946  DATE OF CONSULTATION:  09/10/2011  REFERRING PHYSICIAN:  Dory LarsenKurian D. Kasa, MD  CONSULTING PHYSICIAN:  Annielee Jemmott E. Thelma Bargeaks, MD  I have personally seen and examined Kristi Mckinney.  I have discussed her care with Dr. Belia HemanKasa. We have reviewed the records together.   HISTORY OF PRESENT ILLNESS: Kristi Mckinney is a 69 year old female with a known history of CLL who has been followed by Dr. Orlie DakinFinnegan, who was found to have CLL after a biopsy of a skin lesion several years ago. She has been followed by Dr. Orlie DakinFinnegan without any disease progression. She had a CT scan of her chest made in November as routine follow-up, and no untoward problems were identified. Over the last several weeks, she is currently complaining a more shortness of breath, low-grade fevers, cough and some diarrhea. She came to the Emergency Department where she was found to have hypoxia and was admitted to the hospital with a diagnosis of acute respiratory failure. Since she has been in the hospital, she has made some steady improvement although she has been treated with steroids and antibiotics. She had a CT scan done which revealed diffuse bilateral pulmonary infiltrates that are significantly different than the scan done two months ago. In addition, her mediastinal adenopathy is more prominent on the scans now than they were in the past.   Since she has been in the hospital, she has responded somewhat to her therapy. She is now on nasal CPAP. She states that overall her breathing is improved. She has not had any hemoptysis.   PAST MEDICAL HISTORY: Her past medical history significant for: 1. Depression.  2. Hypertension.  3. Diabetes.  4. CLL.  5. Chronic obstructive pulmonary disease.   ADMISSION MEDICATIONS:  1. Amlodipine 10 mg p.o. daily. 2. Budeprion SR 150 mg b.i.d.  3. Citalopram 40 mg daily. 4. Combivent 2 puffs every 4 hours p.r.n.   5. Diclofenac 75 mg  b.i.d. p.r.n. 6. Hydrochlorothiazide 25 mg a day.  7. Losartan 50 mg a day. 8. Metformin 1000 mg b.i.d.  9. Nitroglycerin p.r.n.   10. Zofran p.r.n.   11. Tramadol p.r.n.  SOCIAL HISTORY: She does not drink or smoke. She is currently retired. She is married. She quit smoking approximately one year ago.   FAMILY HISTORY: Her mother had uterine cancer, and a brother had esophageal cancer.   REVIEW OF SYSTEMS: As per history of present illness, and all other review of systems were asked and were otherwise negative.   PHYSICAL EXAMINATION:  GENERAL: Examination revealed an elderly-appearing woman lying in bed inspiring nasal cannula CPAP. Her oxygen saturations were 99%. Her blood pressure was approximately 115/80. Her heart rate was in the 60s. She looked comfortable and was able to speak in complete sentences.   LUNGS: Her lungs showed diffuse bilateral crackles.   HEART: Her heart was regular. There were no obvious skin lesions present.   ABDOMEN: Her abdomen was soft and nontender. There were no palpable masses.   EXTREMITIES: Without any clubbing, cyanosis, or edema.   IMPRESSION/RECOMMENDATIONS: I did have an opportunity to discuss her care with Dr. Belia HemanKasa today. It does appear as if she has diffuse bilateral pulmonary infiltrates. The differential diagnosis at this point is quite large. There is concern that she may need to have an open lung biopsy, and I have discussed this with the patient. I reviewed with her the indications and risks of thoracotomy and  lung biopsy. Depending upon her x-rays over the weekend, we could elect to do either the right or left side as there is diffuse bilateral disease present. If at all possible, I prefer to do the right side as that is somewhat easier technically. However, there are some enlarged lymph nodes on the left, and we may be able to biopsy some of those as well. We will see how the x-rays progress over the weekend. I also told the patient and her  husband today that there is the possibility that she may improve with the treatment and a biopsy may not be needed. The risks of bleeding, infection, air leak, and death were all reviewed. At the present time, we will see how she does over the next several days and if need be perform this biopsy on Monday.   Thank you very much for allowing me to participate in her care.   ____________________________ Sheppard Plumber. Thelma Barge, MD teo:cbb D: 09/10/2011 15:48:18 ET T: 09/10/2011 17:21:27 ET JOB#: [id number]  cc: Marcial Pacas E. Thelma Barge, MD, <Dictator> Jasmine December MD ELECTRONICALLY SIGNED 09/15/2011 9:17

## 2014-12-09 NOTE — Consult Note (Signed)
Chief Complaint:   Subjective/Chief Complaint Feels well. No signs of active bleeding. Had one dark brown bowel movement. EGD showe candida eophagiis but otherwise negative.  Recommendations: Continue PPI. Diflucan for 1 week. Folow up as OP if needed. Will sign off. Please reconsult GI if needed. Thanks.   Electronic Signatures: Lurline DelIftikhar, Kimbrely Buckel (MD)  (Signed 31-Jan-13 12:52)  Authored: Chief Complaint   Last Updated: 31-Jan-13 12:52 by Lurline DelIftikhar, Ean Gettel (MD)

## 2014-12-09 NOTE — Discharge Summary (Signed)
PATIENT NAME:  Kristi Mckinney, Kristi Mckinney MR#:  [id number] DATE OF BIRTH:  27-Dec-1945  DATE OF ADMISSION:  09/28/2011 DATE OF DISCHARGE:  09/30/2011  DISCHARGE DIAGNOSES:  1. Fever of unknown etiology, acute infection ruled out. 2. Elevated troponin due to demand ischemia. 3. Acute renal failure. 4. Chronic obstructive pulmonary disease.  5. Diabetes.  6. Gastroesophageal reflux disease.  7. Depression.  8. History of CLL with chronic anemia and thrombocytopenia.   DISPOSITION: The patient is being discharged home. She is to followup with Dr. Orlie DakinFinnegan on 10/01/2011 as scheduled. She is to followup with her primary care physician in 1 to 2 weeks after discharge.   DIET: Low sodium, 1800 calorie ADA diet.   ACTIVITY: As tolerated.   DISCHARGE MEDICATIONS:  1. Tramadol 50 mg every 8 hours p.r.n. 2. Amlodipine 10 mg daily. 3. Budeprion 150 mg twice a day. 4. Citalopram 40 mg daily.  5. Combivent 2 puffs every 4 hours p.r.n.  6. Glipizide 2.5 mg daily.  7. DuoNebs every 6 hours p.r.n.  8. Advair 100/50 one puff twice a day. 9. Omeprazole 40 mg daily.   CONSULTANTS:  1. Julien Nordmannimothy Gollan, MD - Cardiology. 2. Gerarda Fractionimothy Finnegan, MD.  RESULTS: Chest x-ray showed no acute abnormality.   Influenza A and B negative.   Microbiology: Urine and blood cultures are negative so far.   White count normal. Platelet count ranging from 46 to 60. Hemoglobin 9.5 to 7.6. Creatinine 1.9 on admission and 1.60 by the time of discharge. Cardiac enzymes: The first troponin was 0.15 and other two troponins were normal.   HOSPITAL COURSE: The patient is a [age] year old female with past medical history of diabetes, chronic obstructive pulmonary disease, gastroesophageal reflux disease, and CLL who was admitted with fever, weakness, and shortness of breath. She recently had a prolonged hospitalization for the same and was discharged home on antibiotics and steroids. The patient reported that once she stopped her antibiotics  and steroids, she started developing fevers and weakness. She was admitted with the diagnosis of fever of unknown origin. The etiology of her fever was unclear. During the hospitalization, the patient had no further episodes of fever. She had no source of infection, her chest x-ray was negative, and her urine and blood cultures have been negative so far. Dr. Orlie DakinFinnegan had planned to repeat a CAT scan of the chest, if her symptoms continued. However, since she was asymptomatic, he will do that as an outpatient. The patient had mildly elevated troponins, which was felt to be due to demand ischemia. She was evaluated by Dr. Mariah MillingGollan who felt that the patient required conservative management. She had mild renal failure, possibly prerenal, which improved with fluids. The patient's chronic obstructive pulmonary disease, diabetes, and gastroesophageal reflux disease remained stable. With IV fluid hydration, there  was a drop in the patient's hemoglobin after which she received 2 units of blood while in the hospital. She is being discharged home in stable condition. She will follow up with her primary care physician and Dr. Orlie DakinFinnegan in 1 to 2 weeks after discharge.   TIME SPENT: 45 minutes. ____________________________ Kristi MeigsSangeeta Anayiah Howden, MD sp:slb D: 09/30/2011 17:32:52 ET     T: 10/01/2011 11:25:43 ET        JOB#: [id number] cc: Kristi MeigsSangeeta Sary Bogie, MD, <Dictator> Kristi MeigsSANGEETA Domitila Stetler MD ELECTRONICALLY SIGNED 10/01/2011 15:35

## 2014-12-09 NOTE — Consult Note (Signed)
Chief Complaint:   Subjective/Chief Complaint Breathing contnues to improve. On nasal cannula now. Lung bx apparently being cancelled due to low plts. May still have melena, according to patient   VITAL SIGNS/ANCILLARY NOTES: **Vital Signs.:   28-Jan-13 07:41   Vital Signs Type POCT   Nurse Fingerstick (mg/dL) FSBS (fasting range 65-99 mg/dL) 264   Comments/Interventions  Nurse Notified   Brief Assessment:   Cardiac Regular    Respiratory clear BS    Gastrointestinal Normal   Routine Hem:  28-Jan-13 04:45    WBC (CBC) 8.1   RBC (CBC) 2.71   Hemoglobin (CBC) 8.9   Hematocrit (CBC) 26.8   Platelet Count (CBC) 70   MCV 99   MCH 32.7   MCHC 33.1   RDW 14.5   Neutrophil % 48.7   Lymphocyte % 48.2   Monocyte % 2.9   Eosinophil % 0.0   Basophil % 0.2   Neutrophil # 3.9   Lymphocyte # 3.9   Monocyte # 0.2   Eosinophil # 0.0   Basophil # 0.0  Routine Chem:  28-Jan-13 04:45    Glucose, Serum 243   BUN 34   Creatinine (comp) 1.59   Sodium, Serum 145   Potassium, Serum 4.4   Chloride, Serum 109   CO2, Serum 26   Calcium (Total), Serum 8.5   Anion Gap 10   Osmolality (calc) 304   eGFR (African American) 42   eGFR (Non-African American) 35   Assessment/Plan:  Assessment/Plan:   Assessment Melena. Anemia.    Plan Dr. Dionne Milo will resume care tomorrow and decide if endoscopy is warranted at this point. THanks.   Electronic Signatures: Verdie Shire (MD)  (Signed 28-Jan-13 08:47)  Authored: Chief Complaint, VITAL SIGNS/ANCILLARY NOTES, Brief Assessment, Lab Results, Assessment/Plan   Last Updated: 28-Jan-13 08:47 by Verdie Shire (MD)

## 2014-12-09 NOTE — Consult Note (Signed)
Chief Complaint:   Subjective/Chief Complaint Recent events noted. She feels somewhat better but remains SOB. No active GI bleed noted and H and H seems to be stable.   VITAL SIGNS/ANCILLARY NOTES: **Vital Signs.:   23-Jan-13 11:10   Pulse Pulse 82   Respirations Respirations 18   Systolic BP Systolic BP 540   Diastolic BP (mmHg) Diastolic BP (mmHg) 54   Mean BP 79   Pulse Ox % Pulse Ox % 93   Pulse Ox Heart Rate 82   Nurse Fingerstick (mg/dL) FSBS (fasting range 65-99 mg/dL) 188   Brief Assessment:   Additional Physical Exam Abdomen is benign.   Lab:  23-Jan-13 08:55    pH (ABG) 7.47   PCO2 30   PO2 66   FiO2 100   Base Excess -1.3   HCO3 21.8   O2 Saturation 94.0   Specimen Type (ABG) ARTERIAL   Patient Temp (ABG) 37.0  Routine Hem:  23-Jan-13 09:21    WBC (CBC) 7.5   RBC (CBC) 2.67   Hemoglobin (CBC) 8.7   Hematocrit (CBC) 26.3   Platelet Count (CBC) 68   MCV 99   MCH 32.5   MCHC 33.0   RDW 14.1   Neutrophil % 67.3   Lymphocyte % 27.6   Monocyte % 4.3   Eosinophil % 0.6   Basophil % 0.2   Neutrophil # 5.1   Lymphocyte # 2.1   Monocyte # 0.3   Eosinophil # 0.0   Basophil # 0.0  Routine Chem:  23-Jan-13 09:21    Glucose, Serum 184   BUN 14   Creatinine (comp) 1.02   Sodium, Serum 140   Potassium, Serum 3.7   Chloride, Serum 104   CO2, Serum 23   Calcium (Total), Serum 8.2   Anion Gap 13   Osmolality (calc) 285   eGFR (African American) >60   eGFR (Non-African American) 58   Assessment/Plan:  Assessment/Plan:   Assessment Anemia and thrombocytopenia most likely secondary to CLL although a component of GI bleed is possible as well as she describes dark stool few days ago. No signs of active GI bleed.    Plan Liquid diet. I will start her on IV PPI. An EGD is not an option at this time due to her poor respiratory status and abscence of active bleeding. Will follow.   Electronic Signatures: Jill Side (MD)  (Signed 23-Jan-13  12:40)  Authored: Chief Complaint, VITAL SIGNS/ANCILLARY NOTES, Brief Assessment, Lab Results, Assessment/Plan   Last Updated: 23-Jan-13 12:40 by Jill Side (MD)

## 2014-12-09 NOTE — H&P (Signed)
PATIENT NAME:  Kristi Mckinney, Kristi Mckinney MR#:  [id number] DATE OF BIRTH:  [birth year]  DATE OF ADMISSION:  09/27/2011  PRIMARY CARE PHYSICIAN: Dr. Darreld Mclean   CHIEF COMPLAINT: Fever, malaise, and shortness of breath.   HISTORY OF PRESENT ILLNESS: This is a 69 year old female who comes in from home due to fever and also malaise. The patient says that she had not been feeling well for the past 24 hours and slept most of the day on Saturday. On Sunday she was having fevers and chills and also poor p.o. intake and also some nausea but no true vomiting. The patient was just recently admitted to the hospital for fevers and pneumonia and finished p.o. antibiotic therapy and a prednisone taper just two days ago. Shortly after she finished her antibiotic therapy and prednisone she started to feel ill again. She does admit to some shortness of breath and weakness but it is not as severe as she had during her previous admission. When brought to the Emergency Room the patient was still noted to have a fever of 101.6. At home her fever was as high as 104. She did not take any Tylenol at home. She does admit to some mild chest pain, which is reproducible in nature.   The patient denies any dysuria or hematuria. She denies any diarrhea. She denies any sick contacts. The patient was brought to the hospital for further evaluation.   REVIEW OF SYSTEMS: CONSTITUTIONAL: Positive documented fever of 101. No weight gain or weight loss. EYES: No blurry or double vision. ENT: No tinnitus or postnasal drip. No redness of the oropharynx. RESPIRATORY: No cough, no wheeze, no hemoptysis. Positive dyspnea. CARDIOVASCULAR: No chest pain, no orthopnea, no palpitations, no syncope.  GI: Positive nausea. No vomiting, no diarrhea, no abdominal pain, no melena or hematochezia.  GU: No dysuria or hematuria. ENDOCRINE: No polyuria, nocturia, or heat or cold intolerance.  HEMATOLOGIC: No anemia, no bruising, no bleeding. INTEGUMENT: No rashes, no  lesions.  MUSCULOSKELETAL: No arthritis, no swelling, and no gout. NEUROLOGIC: No numbness, no tingling, no ataxia. No seizure activity.  PSYCH: No anxiety, no insomnia, no ADD.   PAST MEDICAL HISTORY:  1. Diabetes.  2. Chronic obstructive pulmonary disease. 3. Gastroesophageal reflux disease. 4. Hypertension.  5. History of CLL.  6. Depression.   ALLERGIES: No known drug allergies.   SOCIAL HISTORY: Does have a 40 pack-year smoking history. No alcohol abuse. No illicit drug abuse. Lives at home with her husband.   FAMILY HISTORY: Mother died from uterine cancer. She cannot recall what her father died from.   CURRENT MEDICATIONS:  1. Tramadol 50 mg every eight hours as needed.  2. Amlodipine 10 mg daily.  3. Bupropion 150 mg b.i.d.  4. Celexa 40 mg daily.  5. Sublingual nitroglycerin as needed.  6. Combivent 2 puffs every four hours.  7. Glipizide 2.5 mg daily.  8. Albuterol inhaler 2 puffs every four hours p.r.n.  9. Albuterol ipratropium SVNs every four hours p.r.n.  10. Advair 100/50 1 puff b.i.d.  11. Omeprazole 40 mg daily.    PHYSICAL EXAMINATION: VITAL SIGNS: Temperature 98.9, pulse 78, respirations 18, blood pressure 123/55, saturations 98% on room air.   GENERAL: She is a pleasant appearing female in no apparent distress.   HEENT: Atraumatic, normocephalic. Her extraocular muscles are intact. The pupils are equal and reactive to light. Sclerae anicteric. No conjunctival injection. No oropharyngeal erythema.   NECK: Supple. No jugular venous distention, no bruits, no lymphadenopathy, no thyromegaly.  HEART: Regular rate and rhythm. No murmurs, no rubs, no clicks.   LUNGS: Clear to auscultation bilaterally. No rales, no rhonchi, no wheezes.   ABDOMEN: Soft, flat, nontender, nondistended. Has good bowel sounds. No hepatosplenomegaly appreciated.   EXTREMITIES: No evidence of any cyanosis, clubbing, or peripheral edema. Has +2 pedal and radial pulses bilaterally.    NEUROLOGICAL: The patient is alert, awake, and oriented times three with no focal motor or sensory deficits appreciated bilaterally.   SKIN: Moist and warm with no rashes appreciated.   LYMPHATIC: There is no cervical or axillary lymphadenopathy.  LABORATORY, DIAGNOSTIC, AND RADIOLOGICAL DATA: Serum glucose 200, BUN 21, creatinine 1.9, sodium 133, potassium 3.8, chloride 96, bicarbonate 24. LFTs are within normal limits. Troponin 0.15, white cell count is 9.6, hemoglobin 9.1, hematocrit 26.9, platelet count 46,000.   ASSESSMENT AND PLAN: This is a 69 year old female with a history of CLL, chronic obstructive pulmonary disease, hypertension, gastroesophageal reflux disease, and depression who came to the hospital with weakness, fever, and shortness of breath.  1. Fever of unknown origin: The etiology of the patient's fever currently is unclear. She was recently in the hospital, admitted for pneumonia, although she does not have any respiratory symptoms presently other than shortness of breath. There is no cough, no upper respiratory symptoms. Her chest x-ray does not show any evidence of acute pneumonia. There is a urinalysis still pending, which we will follow up. For now, I will treat her empirically with vancomycin and Zosyn. Follow blood cultures. Follow her fever curve. There is some concern that this could be recurrence of her CLL. She was supposed to get a lung biopsy during her last admission, although that was discontinued as she got better with antibiotics and prednisone. Questionable if there is need for biopsy now. I will get oncology consult to further address this. She may need a repeat CT of her chest once her renal failure improves so we can do it with contrast.  2. Elevated troponin: The patient did not have any chest pain. She does have a history of underlying chronic obstructive pulmonary disease and she had some shortness of breath. Questionable if this is related to her fever and  her shortness of breath. I will observe her overnight on telemetry and check serial cardiac markers. Continue aspirin and nitroglycerin. I cannot anticoagulate her given her thrombocytopenia and anemia from her CLL for now.  3. Acute renal failure: This is likely prerenal in nature related to her nausea and poor p.o. intake. I will gently hydrate her with IV fluids. Follow her BUN and creatinine and urine output. Renal dose medications. Avoid nephrotoxins.  4. Chronic obstructive pulmonary disease: I do not appreciate acute exacerbation at this time.  Continue p.r.n. nebulizers and Combivent.  5. Diabetes: We will place on sliding scale insulin and glipizide.  6. Gastroesophageal reflux disease: Continue Prilosec. 7. Depression: Continue Celexa and bupropion.  8. History of CLL: As mentioned I will get an oncology consult. The patient is well known to Dr. Orlie DakinFinnegan. Follow her serial counts and continue care as per oncology for now.  9. CODE STATUS: The patient is a FULL CODE.   TIME SPENT: 55 minutes.   ____________________________ Rolly PancakeVivek J. Cherlynn KaiserSainani, MD vjs:bjt D: 09/28/2011 01:53:05 ET T: 09/28/2011 08:49:30 ET JOB#: [id number]  cc: Rolly PancakeVivek J. Cherlynn KaiserSainani, MD, <Dictator> Leanna SatoLinda M. Miles, MD Houston SirenVIVEK J Deundre Thong MD ELECTRONICALLY SIGNED 10/02/2011 10:36

## 2014-12-09 NOTE — Consult Note (Signed)
Brief Consult Note: Diagnosis: bilateral interstitial infiltrates.   Patient was seen by consultant.   Consult note dictated.   Recommend to proceed with surgery or procedure.   Discussed with Attending MD.   Comments: Will schedule for a left thoracotomy and lung biopsy next week if there is no improvement.  Electronic Signatures: Jasmine Decemberaks, Tkai Serfass E (MD)  (Signed 24-Jan-13 15:42)  Authored: Brief Consult Note   Last Updated: 24-Jan-13 15:42 by Jasmine Decemberaks, Lincy Belles E (MD)

## 2014-12-09 NOTE — Consult Note (Signed)
Chief Complaint:   Subjective/Chief Complaint EGD showed candida esophagitis, otherwise normal. Will treat with Diflucan 100 mg a day for 7 days (Diflucan interacts with Citalopram but patient is on a low dose Citlopram which is considered to be safe when administerd with Fluconazole). Resume diet. Follow H and H. Will follow.   Electronic Signatures: Lurline DelIftikhar, Anais Denslow (MD)  (Signed 30-Jan-13 09:44)  Authored: Chief Complaint   Last Updated: 30-Jan-13 09:44 by Lurline DelIftikhar, Lilliahna Schubring (MD)

## 2014-12-09 NOTE — Discharge Summary (Signed)
PATIENT NAME:  Kristi Mckinney, Kristi Mckinney MR#:  [id number] DATE OF BIRTH:  [birth year]  DATE OFTRYNITY, SKOUSEN 09/07/2011 DATE OF DISCHARGE:  09/17/2011  ADMITTING PHYSICIAN: Delfino Lovett, MD   DISCHARGING PHYSICIAN: Larena Glassman, MD   PRIMARY CARE PHYSICIAN: Darreld Mclean, MD    REFERRING PHYSICIAN: Dr. Shaune Pollack   ADMITTING DIAGNOSIS: Fever.   DISCHARGE DIAGNOSES:  1. Fever secondary to pneumonia. 2. Lymphadenopathy. 3. Acute respiratory failure.  4. Anemia.  5. Thrombocytopenia.  6. Chronic lymphocytic leukemia. 7. Diabetes. 8. Depression. 9. Hypertension. 10. Acute renal failure. 11. Candida esophagitis.  12. Anemia of chronic disease.  13. Debilitation.   CONSULTANTS:  1. Dr. Orlie Dakin  2. Dr. Thedore Mins  3. Case Management   4. Physical therapy   5. Occupational therapy   6. Dr. Niel Hummer  7. Dr. Hulda Marin  8. Dr. Belia Heman    TESTS DONE DURING THIS HOSPITALIZATION:  1. Chest x-ray 09/07/2011 showed no evidence of focal regions of consolidation or focal infiltrates.  2. Chest x-ray 09/08/2011 showed persistent reticular nodular parenchymal pattern within the lungs.  3. CT chest, abdomen, and pelvis 09/09/2011 showed persistent thoracic and retroperitoneal lymphadenopathy consistent with the patient's known history of CLL.  4. Chest x-ray 09/14/2011 showed Port-A-Cath device is present with the tip of the catheter in the superior vena cava. The heart is at the upper limits of normal in size. Density projects in the posterior gutter at the lung base. Could represent lower lobe pneumonia versus atelectasis.  5. Echo Doppler 01/23 showed essentially normal study. Left ventricular systolic function is normal. EF greater than 55%. Transmitral mitral Doppler flow is normal for age. Left ventricular wall motion is normal. Right ventricular systolic function is normal. Left atrial size is normal. There is mild tricuspid regurgitation. Right ventricular systolic pressure is normal. This was done by Dr. Julien Nordmann.   HOSPITAL COURSE: Please refer to Dr. Hilbert Odor interim summary which covers from date of admission 09/07/2011 to 09/13/2011.   1. Fever of unknown origin. The patient's fever curve improved. She was about to get lung biopsy, however, since her symptoms improved Dr. Orlie Dakin and Dr. Thelma Barge did not believe that this was a manifestation of her CLL. She was continued initially on vancomycin, Zosyn, and azithromycin. These were discontinued upon discharge and switched to oral Augmentin. She was also switched to oral steroids which was tapered. 2. Acute respiratory failure, etiology unclear, possible pneumonia. She was continued on Vancomycin, Zosyn, and steroids. She has received nebulizers and inhalers.   3. Anemia, thrombocytopenia. Her hemoglobin improved. She did receive transfusion. She was seen by GI and had EGD done on 09/16/2011 by Dr. Niel Hummer which showed normal stomach, normal duodenum, Candida esophagitis. She was started on Diflucan for this and PPI. She will follow-up with him as outpatient.  4. CLL. As per Oncology, the patient's needs to get a repeat CT scan in three weeks' time.  5. 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 She was discharged on glipizide because of her acute renal failure.   6. Depression. The patient was continued on Celexa.   7. Hypertension. The patient was continued on Norvasc. Her other hypertensive medications were held due to acute renal failure.  8. Acute renal failure most likely from contrast nephropathy. Metformin, losartan, and hydrochlorothiazide were all held. Dr. Thedore Mins recommended that she get Mucomyst and CT contrast prep with bicarb prior to any future CTs.  9. Anemia of chronic disease. This was stable.  10. DVT prophylaxis. Maintained with TED stockings and  SCDs.  DISPOSITION: The patient was evaluated by PT and recommended for home health.   She was discharged on 09/17/2011. Temperature 98, heart rate 69, respiratory rate 20, blood pressure  120/57, sating 95% on 2 liters. LUNGS: Clear to auscultation. CARDIOVASCULAR: Regular rate and rhythm. ABDOMEN: Benign.   DISCHARGE MEDICATIONS:   1. Tramadol 50 mg every eight hours p.r.n. pain.  2. Amlodipine 10 mg daily.  3. Budeprion SR 150 mg one tablet 2 times a day.  4. Citalopram 40 mg daily.  5. Nitroglycerin 0.4 mg every five minutes as needed x3 doses for pain.  6. Combivent 18 mcg/103 mcg inhaled 2 puffs every four hours as needed. 7. Fluconazole 100 mg p.o. daily x5 days.  8. Glipizide XL 2.5 mg at breakfast. 9. Albuterol MDI 2 puffs q.4 hours p.r.n. shortness breath.  10. Albuterol/ipratropium SVN 3 mL q.4 hours p.r.n shortness of breath.   11. Advair Diskus 100/50 one puff b.i.d.   12. Nebulizer machine x1.  13. Augmentin 500 mg p.o. b.i.d. for five days.  14. Omeprazole 40 mg daily.  15. Prednisone 20 mg daily x1, then 20 mg daily x3 days, then 10 mg daily x3 days, then stop.  DO NOT TAKE: Metformin, hydrochlorothiazide, diclofenac, and losartan until she sees primary care physician.  OXYGEN: 2 liters nasal cannula oxygen.   REFERRALS:  1. Home PT. 2. Home health.   FOLLOW-UP:  1. Follow-up with Dr. Darreld McleanLinda Miles in one week. When she sees Dr. Marvis MoellerMiles, she should get repeat BMP. 2. Follow-up with Dr. Orlie DakinFinnegan in two weeks. 3. Follow-up with Dr. Thedore MinsSingh in two weeks. 4. Follow-up with Dr. Niel HummerIftikhar in 3 to 4 weeks.  CODE STATUS: FULL CODE.   TOTAL TIME SPENT ON DISCHARGE: 45 minutes.   Thank you for allowing us to participate in the care of this patient.  ____________________________ Corie ChiquitoAmir A. Lafayette DragonFirozvi, MD aaf:drc D: 09/17/2011 15:10:44 ET T: 09/18/2011 13:05:05 ET JOB#: [id number]  cc: Karolee OhsAmir A. Lafayette DragonFirozvi, MD, <Dictator> Leanna SatoLinda M. Miles, MD Mosetta PigeonHarmeet Singh, MD Tollie Pizzaimothy J. Orlie DakinFinnegan, MD Lurline DelShaukat Iftikhar, MD Dory LarsenKurian D. Kasa, MD Karolee OhsAMIR Laverda PageA Albertus Chiarelli MD ELECTRONICALLY SIGNED 09/19/2011 21:43

## 2014-12-09 NOTE — H&P (Signed)
PATIENT NAME:  Kristi LeschesRIGGSBEE, Kristi C MR#:  [id number] DATE OF BIRTH:  Mar 04, 1946  DATE OF ADMISSION:  09/07/2011  PRIMARY CARE PHYSICIAN: Dr. Darreld McleanLinda Miles  REQUESTING PHYSICIAN: Dr. Governor Rooksebecca Lord  CHIEF COMPLAINT: Fever.   HISTORY OF PRESENT ILLNESS: Patient is a 69 year old female with a known history of CLL followed by Dr. Orlie DakinFinnegan was found to have high-grade fever at home up to 103.7 with body ache and weakness. She called Dr. Milinda CaveFinnegan's office who requested her to come to the Emergency Department. While in the ED, she was found to have a temperature of 100 degrees Fahrenheit. She denies any coughing or shortness of breath but feels very weak. She is also reporting diarrhea for about a week and poor p.o. intake. While in the ED her chest x-ray is showing possible interstitial infiltrate. She is being admitted for further evaluation and management.   PAST MEDICAL HISTORY:  1. Depression.  2. Hypertension. 3. Diabetes.  4. CLL.   5. Chronic obstructive pulmonary disease.   MEDICATIONS AT HOME:  1. Amlodipine 10 mg p.o. daily.  2. Budeprion SR 150 mg p.o. b.i.d.  3. Citalopram 40 mg p.o. daily.  4. Combivent 2 puffs inhaled every four hours as needed.  5. Diclofenac 75 mg p.o. b.i.d. as needed.  6. Hydrochlorothiazide 25 mg p.o. daily.  7. Losartan 50 mg p.o. daily.  8. Metformin 1000 mg p.o. b.i.d.  9. Nitroglycerin 0.4 mg sublingual as needed every five minutes.  10. Zofran 8 mg p.o. 3 times a day as needed. 11. Tramadol 50 mg p.o. every eight hours as needed.   ALLERGIES: No known drug allergies.   SOCIAL HISTORY: No smoking. No alcohol.   FAMILY HISTORY: Mother had uterine cancer. Stepfather had bone cancer. Other brother had esophageal cancer.   REVIEW OF SYSTEMS: CONSTITUTIONAL: Positive for fever, fatigue, weakness. EYES: No blurry or double vision. ENT: No tinnitus or ear pain. RESPIRATORY: No cough, wheezing, hemoptysis. CARDIOVASCULAR: No chest pain, orthopnea, edema.  GASTROINTESTINAL: No nausea, vomiting. Positive for diarrhea. GENITOURINARY: No dysuria, hematuria. ENDOCRINE: No polyuria, nocturia. HEMATOLOGY: Positive for anemia and thrombocytopenia. SKIN: No rash or lesion. MUSCULOSKELETAL: Body ache and generalized weakness. NEUROLOGIC: No tingling, numbness, weakness. PSYCHIATRIC: Positive for depression. No anxiety.   PHYSICAL EXAMINATION:  VITAL SIGNS: Temperature 100 degrees Fahrenheit, heart rate 89 per minute, respirations 20 per minute, blood pressure 112/68 mmHg. She is saturating 91% on room air.   GENERAL: Patient is a 69 year old female lying in the bed comfortably without any acute distress.   EYES: Pupils equal, round, reactive to light and accommodation. No scleral icterus. Extraocular muscles intact.   HENT: Head atraumatic, normocephalic. Oropharynx and nasopharynx clear.   NECK: Supple. No jugular venous distention. No thyroid enlargement or tenderness.   LUNGS: Clear to auscultation bilaterally. No wheezes, rales, rhonchi, crepitation.   ABDOMEN: Soft, nontender, nondistended. Bowel sounds present. No organomegaly or masses.   CARDIOVASCULAR: S1, S2 normal. No murmur, rubs, or gallop.   NEUROLOGICAL: Nonfocal examination. Cranial nerves II through XII intact. Muscle strength 5/5 in all extremities. Sensation intact.   PSYCH: Patient is oriented to time, place, and person x3.   SKIN: No obvious rash, lesion, ulcer.   LABORATORY, DIAGNOSTIC, AND RADIOLOGICAL DATA: Normal BMP except BUN 27, creatinine 1.80, blood glucose 121. CBC showed white count 8.6, hemoglobin 8.8, hematocrit 26.7, platelets 85. Negative influenza test. Negative urinalysis.   Chest x-ray shows no focal consolidation or infiltrate, possible interstitial infiltrate.   EKG shows normal sinus rhythm. No  major ST-T changes.   IMPRESSION AND PLAN:  1. Suspected pneumonia based on chest x-ray although she does not have any obvious signs or symptoms of upper  respiratory tract infection. Will obtain blood and sputum culture. This very well could be just viral syndrome although she did have high-grade fever with underlying history of CLL. Will start on Rocephin, Zithromax and monitor. If her repeat chest x-ray still shows no pneumonia the antibiotics can be discontinued at this time. Will obtain blood and sputum culture.  2. 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 Will hydrate and recheck. This is likely prerenal. Will avoid any nephrotoxins.  3. Anemia, likely acute on chronic possibly due to underlying CLL also. Will consult oncology. Also consult GI. Will hold off blood transfusion at this time. I do not find any obvious blood loss or GI bleeding. If her hemoglobin drops less than 8, may need transfusion. keep her on protonix for now. 4. CLL. Will consult oncology. Monitor her blood counts.  5. Possible viral syndrome. Will provide symptomatic management. Her influenza test is negative.  6. Diabetes. Will hold off metformin. Start her on sliding scale insulin.  7. Diarrhea. This could be viral in nature. Will provide symptomatic management with IV fluids and check her stool for any infectious etiology.  8. Hypertension. Will continue her home medications except hydrochlorothiazide and losartan considering acute renal failure. Monitor her blood pressure. If drops, may need to hold norvasc. 9. Chronic obstructive pulmonary disease. Will continue Combivent.       TOTAL TIME TAKING CARE OF THIS PATIENT: 55 minutes.  ____________________________ Ellamae Sia. Sherryll Burger, MD vss:cms D: 09/07/2011 17:43:13 ET T: 09/08/2011 05:39:52 ET JOB#: [id number]  cc: Ruhani Umland S. Sherryll Burger, MD, <Dictator> Leanna Sato, MD Ellamae Sia [facility] MD ELECTRONICALLY SIGNED 09/08/2011 11:14

## 2014-12-09 NOTE — Consult Note (Signed)
Chief Complaint:   Subjective/Chief Complaint Much better clinically. still reporting black stools. H and H seems to be stable. Platelet count 70. Will proceed with an EGD in am. Procedure discussed with her in detail. Further recommendations to follow.   Electronic Signatures: Lurline DelIftikhar, Quadir Muns (MD)  (Signed 29-Jan-13 18:26)  Authored: Chief Complaint   Last Updated: 29-Jan-13 18:26 by Lurline DelIftikhar, Shaunie Boehm (MD)

## 2014-12-09 NOTE — Consult Note (Signed)
Brief Consult Note: Diagnosis: Anemia.   Comments: Patient in deep sleep. Discussed with family members who deny any obvious GI bleed. Apparently, she has had an EGD and [facility]. Most likely anemia is secondary to CLL although [facility] and [facility] her in am. Further recommendations to follow.  Electronic Signatures: Lurline DelIftikhar, Markice Torbert (MD)  (Signed 22-Jan-13 19:09)  Authored: Brief Consult Note   Last Updated: 22-Jan-13 19:09 by Lurline DelIftikhar, Terrianne Cavness (MD)

## 2014-12-09 NOTE — Consult Note (Signed)
History of Present Illness:   Reason for Consult CLL, now with high fevers and possible viral syndrome.    HPI Patient last seen in clinic in December 2012.  Over the past week or so she states she has been having fevers as high as 103??F.  She has had increasing weakness and fatigue and a poor appetite over the same time frame.  She denies any cough or chest pain.  She has no urinary complaints.  She denies any nausea, vomiting, constipation, or diarrhea.  She feels generally terrible, but offers no further specific complaints.   PFSH:   Additional Past Medical and Surgical History Past medical history: COPD, diabetes, depression, arthritis, hypercholesterolemia, hypertension.  Past surgical history: LEEP biopsy.  Family history: Cervical and gastric cancer.  CAD, diabetes.  Social history: Positive tobacco use one pack per day for 30 years.  Patient has been offered smoking cessation counseling. Denies alcohol use.   Review of Systems:   Review of Systems   As per HPI. Otherwise, 10 point system review was negative.status: ECoG 2.  NURSING NOTES: **Vital Signs.:   22-Jan-13 14:30    Vital Signs Type: 1 hr Post Blood    Temperature Temperature (F): 98.3    Celsius: 36.8    Temperature Source: oral    Pulse Pulse: 71    Pulse source: per Dinamap    Respirations Respirations: 18    Systolic BP Systolic BP: 503    Diastolic BP (mmHg) Diastolic BP (mmHg): 61    Mean BP: 83    BP Source: Dinamap    Pulse Ox % Pulse Ox %: 93    Oxygen Delivery: 2L; Nasal Cannula   Physical Exam:   Physical Exam General: Ill-appearing, no acute distress.  Eyes: Anicteric sclera. Lungs: Clear to auscultation bilaterally. Heart: Regular rate and rhythm. No rubs, murmurs, or gallops. Abdomen: Soft, normoactive bowel sounds.  Extremities: No edema, cyanosis, or clubbing. Neuro: Alert, answering all questions appropriately. Cranial nerves grossly intact. Skin: No rashes or petechiae  noted. Psych: Normal affect. Lymphatics: No palpable lymphadenopathy.    No Known Allergies:     traMADol 50 mg tablet: 1 tab(s) orally every 8 hours x 30 days, As Needed- for Pain , Active, 90, 2   ondansetron 8 mg tablet: 1 tab(s) orally 3 times a day x 30 days as needed  , Active, 90, 2   losartan 50 mg oral tablet: 1 tab(s) orally once a day, Active, 0, None   amlodipine 10 mg oral tablet: 1 tab(s) orally once a day for high blood pressure., Active, 0, None   Budeprion SR 150 mg/12 hours oral tablet, extended release: 1 tab(s) orally 2 times a day (morning and no later than 5pm) for depression., Active, 0, None   hydrochlorothiazide 25 mg oral tablet: 1 tab(s) orally once a day for high blood pressure., Active, 0, None   citalopram 40 mg oral tablet: 1 tab(s) orally once a day for depression., Active, 0, None   metformin 1000 mg oral tablet: 1 tab(s) orally 2 times a day for diabetes mellitus., Active, 0, None   nitroglycerin 0.4 mg sublingual tablet: 1 tab(s) sublingual every 5 minutes as needed for no more than 3 doses if no relief call 911., Active, 0, None   diclofenac sodium 75 mg oral enteric coated tablet: 1 tab(s) orally 2 times a day as needed for pain with food., Active, 0, None   Combivent 18 mcg-103 mcg-/inh inhalation aerosol: 2 puff(s) inhaled every 4  hours as needed., Active, 0, None  Routine Hem:  22-Jan-13 04:48    WBC (CBC) 7.1   RBC (CBC) 2.36   Hemoglobin (CBC) 7.7   Hematocrit (CBC) 23.1   Platelet Count (CBC) 73   MCV 98   MCH 32.6   MCHC 33.4   RDW 13.8   Neutrophil % 58.2   Lymphocyte % 34.9   Monocyte % 6.0   Eosinophil % 0.6   Basophil % 0.3   Neutrophil # 4.1   Lymphocyte # 2.5   Monocyte # 0.4   Eosinophil # 0.0   Basophil # 0.0  Routine Chem:  22-Jan-13 04:48    Glucose, Serum 111   BUN 25   Creatinine (comp) 1.46   Sodium, Serum 138   Potassium, Serum 3.7   Chloride, Serum 100   CO2, Serum 24   Calcium (Total), Serum 7.9    Anion Gap 14   Osmolality (calc) 281   eGFR (African American) 46   eGFR (Non-African American) 38  Hepatic:  22-Jan-13 04:48    Bilirubin, Total 0.3   Alkaline Phosphatase 50   SGPT (ALT) 36   SGOT (AST) 60   Total Protein, Serum 6.2   Albumin, Serum 2.1  Routine Coag:  22-Jan-13 04:48    Prothrombin 15.8   INR 1.2  Routine Chem:  22-Jan-13 04:48    Magnesium, Serum 1.6   Assessment and Plan:   Impression   CLL and fevers.   Plan   1.  CLL:  Patient received 3 cycles of FCR with excellent improvement of her lymphadenopathy, completing on December 09, 2010.  Patient was supposed to have a restaging CT scan in several weeks, but given her fevers and worsening performance status have ordered a CT of the chest, abdomen, pelvis with contrast for September 09, 2011.  Because of her elevated creatinine, she will receive continuous IV fluids for 24 hours before and 24 hours after her procedure.  Fevers: No obvious source of infection, CT scan as above. Thrombocytopenia: Stable, monitor. Anemia: Patient received one unit packed red blood cells today. consult, will follow.   Electronic Signatures: Delight Hoh (MD)  (Signed 22-Jan-13 16:44)  Authored: HISTORY OF PRESENT ILLNESS, PFSH, ROS, NURSING NOTES, PE, ALLERGIES, HOME MEDICATIONS, LABS, ASSESSMENT AND PLAN   Last Updated: 22-Jan-13 16:44 by Delight Hoh (MD)

## 2014-12-09 NOTE — Consult Note (Signed)
History of Present Illness:   Reason for Consult CLL, readmitted with high fevers, dehydration, acute renal failure.    HPI   Patient was recently admitted to the hospital with the same complaint, but no distinct etiology was never determined.  Patient states she completed her course of antibiotics and prednisone taper 2 days ago and almost instantly started having high fevers again.  She has no other symptoms.  She denies any shortness of breath or cough.  She denies any chest pain or hemoptysis.  She has no abdominal pain and denies any nausea, vomiting, constipation, or diarrhea.  She has no urinary complaints.  Patient offers no further specific complaints.   PFSH:   Additional Past Medical and Surgical History Past medical history: COPD, diabetes, depression, arthritis, hypercholesterolemia, hypertension.  Past surgical history: LEEP biopsy.  Family history: Cervical and gastric cancer.  CAD, diabetes.  Social history: Positive tobacco use one pack per day for 30 years.  Patient has been offered smoking cessation counseling. Denies alcohol use.   Review of Systems:   Review of Systems   As per HPI. Otherwise, 10 point system review was negative.status: ECoG 2.  NURSING NOTES: **Vital Signs.:   11-Feb-13 11:05    Vital Signs Type: Admission    Temperature Temperature (F): 97.5    Celsius: 36.3    Temperature Source: oral    Pulse Pulse: 68    Pulse source: per Dinamap    Respirations Respirations: 18    Systolic BP Systolic BP: 387    Diastolic BP (mmHg) Diastolic BP (mmHg): 67    Mean BP: 84    BP Source: Dinamap    Pulse Ox % Pulse Ox %: 95    Pulse Ox Activity Level: At rest    Oxygen Delivery: 2L   Physical Exam:   Physical Exam General: No acute distress.  Eyes: Anicteric sclera. Lungs: Clear to auscultation bilaterally. Heart: Regular rate and rhythm. No rubs, murmurs, or gallops. Abdomen: Soft, normoactive bowel sounds.  Extremities: No edema,  cyanosis, or clubbing. Neuro: Alert, answering all questions appropriately. Cranial nerves grossly intact. Skin: No rashes or petechiae noted. Psych: Normal affect. Lymphatics: No palpable lymphadenopathy.    No Known Allergies:     traMADol 50 mg tablet: 1 tab(s) orally every 8 hours x 30 days, As Needed- for Pain , Active, 90, 2   amlodipine 10 mg oral tablet: 1 tab(s) orally once a day for high blood pressure., Active, 0, None   Budeprion SR 150 mg/12 hours oral tablet, extended release: 1 tab(s) orally 2 times a day (morning and no later than 5pm) for depression., Active, 0, None   citalopram 40 mg oral tablet: 1 tab(s) orally once a day for depression., Active, 0, None   Combivent 18 mcg-103 mcg-/inh inhalation aerosol: 2 puff(s) inhaled every 4 hours as needed., Active, 0, None   fluconazole 100 mg oral tablet: 1 tab(s) orally once a day, Active, 0, None   glipiZIDE 2.5 mg oral tablet, extended release: 1 tab(s) orally once a day, Active, 0, None   albuterol-ipratropium: 2 puff(s) inhaled every 4 hours (5 times/day), As Needed- for Shortness of Breath , Active, 0, None   albuterol-ipratropium 2.5 mg-0.5 mg/3 mL inhalation solution: 3 milliliter(s) inhaled 4 times a day, As Needed- for Shortness of Breath , Active, 0, None   Advair Diskus 100 mcg-50 mcg inhalation powder: 1 puff(s) inhaled 2 times a day, Active, 0, None   omeprazole 40 mg oral delayed release capsule:  1 cap(s) orally once a day, Active, 0, None  Routine Hem:  10-Feb-13 22:59    WBC (CBC) 9.6   RBC (CBC) 2.76   Hemoglobin (CBC) 9.1   Hematocrit (CBC) 26.9   Platelet Count (CBC) 46   MCV 98   MCH 33.0   MCHC 33.8   RDW 15.7  Routine Chem:  10-Feb-13 22:59    Glucose, Serum 200   BUN 21   Creatinine (comp) 1.91   Sodium, Serum 133   Potassium, Serum 3.8   Chloride, Serum 96   CO2, Serum 24   Calcium (Total), Serum 8.1  Hepatic:  10-Feb-13 22:59    Bilirubin, Total 0.7   Alkaline Phosphatase 65    SGPT (ALT) 24   SGOT (AST) 18   Total Protein, Serum 6.6   Albumin, Serum 2.7  Routine Chem:  10-Feb-13 22:59    Osmolality (calc) 275   eGFR (African American) 34   eGFR (Non-African American) 28   Anion Gap 13  Cardiac:  10-Feb-13 22:59    Troponin I 0.15   CK, Total 25   CPK-MB, Serum < 0.5   Assessment and Plan:  Impression:   CLL and fevers.  Plan:   1.  CLL:  Patient received 3 cycles of FCR with excellent improvement of her lymphadenopathy, completing on December 09, 2010.  During patient's last admission, she had a CT scan but essentially revealed stable disease.  This is unlikely acute progression of her CLL.  If patient's creatinine improves, would consider re-CT to assess for interval change. Fevers: No obvious source of infection, repeat CT scan as above. Thrombocytopenia: Platelets slightly below baseline, monitor. Anemia: Patient's hemoglobin has slightly trended down, monitor and transfuse if below 8.0.  consult, will follow.   Electronic Signatures: Delight Hoh (MD)  (Signed 11-Feb-13 13:58)  Authored: HISTORY OF PRESENT ILLNESS, PFSH, ROS, NURSING NOTES, PE, ALLERGIES, HOME MEDICATIONS, LABS, ASSESSMENT AND PLAN   Last Updated: 11-Feb-13 13:58 by Delight Hoh (MD)

## 2014-12-09 NOTE — Consult Note (Signed)
PATIENT NAME:  Kristi Mckinney, Kristi Mckinney MR#:  [id number] DATE OF BIRTH:  02-16-1946  DATE OF CONSULTATION:  09/09/2011  REFERRING PHYSICIAN:  Hospitalist  CONSULTING PHYSICIAN:  Lurline DelShaukat Deloris Moger, MD  REASON FOR CONSULTATION: Anemia.   HISTORY OF PRESENT ILLNESS: 69 year old female with history of CLL who is followed by Dr. Orlie DakinFinnegan. Patient was admitted day before yesterday with high fever up to 103. She was also found to be anemic with her hemoglobin much lower than her baseline. She also has thrombocytopenia. Chest x-ray showed questionable interstitial infiltrate. Patient's respiratory status has deteriorated. Patient was transferred to the Intensive Care Unit due to what appears to be either worsening pneumonia versus pneumonitis. She was given 1 unit of packed RBC and her hemoglobin and hematocrit have come up appropriately. According to the patient she had some diarrhea for about a week. Initially the stools were dark and black but then later it cleared. She denies any recent melena, hematochezia, hematemesis, or any other GI symptoms.   PAST MEDICAL HISTORY:  1. Depression. 2. Hypertension. 3. Diabetes. 4. CLL. 5. Chronic obstructive pulmonary disease.   HOME MEDICATIONS:  1. Amlodipine. 2. Combivent. 3. Citalopram.  4. Diclofenac.  5. Hydrochlorothiazide. 6. Losartan. 7. Metformin. 8. Nitroglycerin. 9. Zofran. 10. Tramadol.   ALLERGIES: None.   SOCIAL HISTORY: She does not smoke or drink.   FAMILY HISTORY: Unremarkable.   REVIEW OF SYSTEMS: Negative except for what is mentioned in the History of Present Illness.   PHYSICAL EXAMINATION:  GENERAL: Somewhat obese female, does appear to be in moderate distress secondary to shortness of breath. Clinically she does appear to be pale. No jaundice was noticed.  VITAL SIGNS: Temperature 102.5, pulse 86, respirations 20 to 25, blood pressure 135/50.    NECK: Neck veins flat.   CARDIOVASCULAR: Regular rate and rhythm.   ABDOMEN:  Slightly distended but soft. No significant tenderness was noted on palpation. No hepatosplenomegaly or ascites.   NEUROLOGIC: She is quite awake and alert.   LABORATORY, DIAGNOSTIC, AND RADIOLOGICAL DATA: Most recent electrolytes are unremarkable. White cell count 8.6, hemoglobin 8.8, platelet count 85. Hemoglobin dropped down to 7.7 as of yesterday. She has received 1 unit of packed RBCs and her most recent hemoglobin is 8.7. INR 1.2.   ASSESSMENT AND PLAN: Patient with significant anemia as well as thrombocytopenia. Patient has CLL and her anemia is most likely related to it. There has been some recent drop in her hemoglobin and hematocrit from her baseline. Patient is also reporting black stools although apparently they have resolved prior to admission. She does take nonsteroidal at home raising concerns about some upper GI bleed in the recent past most likely secondary to nonsteroidal-induced gastric mucosal damage. There are no signs of active GI bleeding and her hemoglobin and hematocrit seem to be better after blood transfusion. Patient has very poor respiratory status and was just transferred to Intensive Care Unit secondary to worsening respiratory status. This appears to be secondary to bilateral pneumonia or pneumonitis. I will restart her on IV Protonix to 40 mg IV b.i.d. Follow hemoglobin and hematocrit and transfuse as needed. Agree with liquid diet which can be gradually advanced if there are no signs of active bleeding. EGD is not an option at this point due to absence of active bleeding and patient's very poor respiratory status. Will follow.  ____________________________ Lurline DelShaukat Talibah Colasurdo, MD si:cms D: 09/09/2011 12:46:34 ET T: 09/09/2011 13:01:59 ET JOB#: [id number]  cc: Lurline DelShaukat Locklan Canoy, MD, <Dictator> Lurline DelSHAUKAT Avnoor Koury MD ELECTRONICALLY SIGNED 09/10/2011 12:12

## 2014-12-09 NOTE — Consult Note (Signed)
Chief Complaint:   Subjective/Chief Complaint Covering for Dr. Iftikhar. Breathing appears better. No longer on facemask. Last BM brown in color.   VITAL SIGNS/ANCILLARY NOTES: **Vital Signs.:   26-Jan-13 08:25   Pulse Ox % Pulse Ox % 95   Oxygen Delivery 60%; HFNC   Brief Assessment:   Cardiac Regular    Respiratory clear BS    Gastrointestinal Normal     Routine Hem:  26-Jan-13 03:30    WBC (CBC) 9.8   RBC (CBC) 2.60   Hemoglobin (CBC) 8.5   Hematocrit (CBC) 25.6   Platelet Count (CBC) 70   MCV 98   MCH 32.7   MCHC 33.2   RDW 14.4   Neutrophil % 67.5   Lymphocyte % 30.4   Monocyte % 1.9   Eosinophil % 0.0   Basophil % 0.2   Neutrophil # 6.6   Lymphocyte # 3.0   Monocyte # 0.2   Eosinophil # 0.0   Basophil # 0.0  Routine Chem:  26-Jan-13 03:30    Glucose, Serum 178   BUN 35   Creatinine (comp) 1.53   Sodium, Serum 145   Potassium, Serum 3.3   Chloride, Serum 107   CO2, Serum 26   Calcium (Total), Serum 8.8   Anion Gap 12   Osmolality (calc) 301   eGFR (African American) 44   eGFR (Non-African American) 36   Assessment/Plan:  Assessment/Plan:   Assessment Melena. Stopped.    Plan For lung bx on Monday. Will check back on Monday. Continue PPI for now.  Advanced diet to reg.Thanks   Electronic Signatures: Oh, Paul (MD)  (Signed 26-Jan-13 09:20)  Authored: Chief Complaint, VITAL SIGNS/ANCILLARY NOTES, Brief Assessment, Lab Results, Assessment/Plan   Last Updated: 26-Jan-13 09:20 by Oh, Paul (MD) 

## 2014-12-09 NOTE — Consult Note (Signed)
General Aspect 69 year old female with obesity, HTN, COPD, long smoking hx, CLL, s/p chemo (last in 2012), who comes in from home due to fever and also malaise. Cardiology was consulted for elevated cardiac enz (TNT 0.12).  The patient was just recently admitted to the hospital for fevers and pneumonia and finished p.o. antibiotic therapy and a prednisone taper. Shortly after she finished her antibiotic therapy and prednisone she started to feel ill again.    The patient says that she had not been feeling well and sleeping at home, also with fevers and chills and also poor p.o. intake and also some nausea. some shortness of breath and weakness, chest tightness with deep inspiration.    When brought to the Emergency Room the patient was still noted to have a fever of 101.6. At home her fever was as high as 104. She did not take any Tylenol at home.   The patient denies any dysuria or hematuria. She denies any diarrhea. She denies any sick contacts. The patient was brought to the hospital for further evaluation.    Present Illness . Past medical history: COPD, diabetes, depression, arthritis, hypercholesterolemia, hypertension.  Past surgical history: LEEP biopsy.  Family history: Cervical and gastric cancer.  CAD, diabetes.  Social history: Positive tobacco use one pack per day for 30 years.  Patient has been offered smoking cessation counseling. Denies alcohol use.   Physical Exam:   GEN WD, WN, NAD, obese    HEENT pale conjunctivae    NECK supple    RESP normal resp effort  clear BS    CARD Regular rate and rhythm  Murmur  I-II/VI    Murmur Systolic    Systolic Murmur Out flow    ABD denies tenderness  soft    LYMPH negative neck    EXTR negative edema    SKIN normal to palpation    NEURO motor/sensory function intact    PSYCH alert, A+O to time, place, person, good insight   Review of Systems:   Subjective/Chief Complaint Anorexia    General: Fatigue   Fever/chills  Weakness    Skin: No Complaints    ENT: No Complaints    Eyes: No Complaints    Neck: No Complaints    Respiratory: Short of breath    Cardiovascular: chest tightness with deep inspitation    Gastrointestinal: No Complaints    Genitourinary: No Complaints    Musculoskeletal: No Complaints    Hematologic: No Complaints    Endocrine: No Complaints    Psychiatric: No Complaints    Review of Systems: All other systems were reviewed and found to be negative    Medications/Allergies Reviewed Medications/Allergies reviewed     Leukemia:    Arthritis:    Hypercholesterolemia:    HTN:    depression:    copd:    diabetes.Marland Kitchenoral agent:    Retroperitoneal Lymph nodes:    rt knee surgery:        Admit Diagnosis:   FEVER OF UNKNOWN ORIGIN ELVATED TROPONIN: 28-Sep-2011, Active, FEVER OF UNKNOWN ORIGIN ELVATED TROPONIN      Admit Reason:   Elevated troponin: (790.6) Active, ICD9, Other abnormal blood chemistry   Fever of unknown origin: (780.60) Active, ICD9, Fever, unspecified  Home Medications: Medication Instructions Status  traMADol 50 mg tablet 1 tab(s) orally every 8 hours x 30 days, As Needed- for Pain  Active  amlodipine 10 mg oral tablet 1 tab(s) orally once a day for high blood pressure. Active  Budeprion SR 150 mg/12 hours oral tablet, extended release 1 tab(s) orally 2 times a day (morning and no later than 5pm) for depression. Active  citalopram 40 mg oral tablet 1 tab(s) orally once a day for depression. Active  Combivent 18 mcg-103 mcg-/inh inhalation aerosol 2 puff(s) inhaled every 4 hours as needed. Active  fluconazole 100 mg oral tablet 1 tab(s) orally once a day Active  glipiZIDE 2.5 mg oral tablet, extended release 1 tab(s) orally once a day Active  albuterol-ipratropium 2 puff(s) inhaled every 4 hours (5 times/day), As Needed- for Shortness of Breath  Active  albuterol-ipratropium 2.5 mg-0.5 mg/3 mL inhalation solution 3  milliliter(s) inhaled 4 times a day, As Needed- for Shortness of Breath  Active  Advair Diskus 100 mcg-50 mcg inhalation powder 1 puff(s) inhaled 2 times a day Active  omeprazole 40 mg oral delayed release capsule 1 cap(s) orally once a day Active     Routine Hem:  10-Feb-13 22:59    WBC (CBC) 9.6   RBC (CBC) 2.76   Hemoglobin (CBC) 9.1   Hematocrit (CBC) 26.9   Platelet Count (CBC) 46   MCV 98   MCH 33.0   MCHC 33.8   RDW 15.7  Routine Chem:  10-Feb-13 22:59    Glucose, Serum 200   BUN 21   Creatinine (comp) 1.91   Sodium, Serum 133   Potassium, Serum 3.8   Chloride, Serum 96   CO2, Serum 24   Calcium (Total), Serum 8.1  Hepatic:  10-Feb-13 22:59    Bilirubin, Total 0.7   Alkaline Phosphatase 65   SGPT (ALT) 24   SGOT (AST) 18   Total Protein, Serum 6.6   Albumin, Serum 2.7  Routine Chem:  10-Feb-13 22:59    Osmolality (calc) 275   eGFR (African American) 34   eGFR (Non-African American) 28   Anion Gap 13  Cardiac:  10-Feb-13 22:59    Troponin I 0.15   CK, Total 25   CPK-MB, Serum < 0.5  11-Feb-13 15:08    Troponin I < 0.02   CK, Total 44   CPK-MB, Serum 3.2   EKG:   Interpretation EKG shows NSR with no significant ST or T wave changes    Rate 94   Radiology Results: XRay:    10-Feb-13 20:57, Chest PA and Lateral   Chest PA and Lateral    REASON FOR EXAM:    shortness of breath  COMMENTS:       PROCEDURE: DXR - DXR CHEST PA (OR AP) AND LATERAL  - Sep 27 2011  8:57PM     RESULT: The lungs are clear. The cardiac silhouette and visualized bony   skeleton are unremarkable. A Port-A-Cath is identified on the right the   tip projecting regions or vena cava.    IMPRESSION:      1. Chest radiograph without evidence of acute cardiopulmonary disease.  2. Comparison is made to a prior study dated 09/14/2011.        Verified By: Mikki Santee, M.D., MD    No Known Allergies:   Vital Signs/Nurse's Notes: **Vital Signs.:   11-Feb-13 19:02    Vital Signs Type Routine   Temperature Temperature (F) 98.5   Celsius 36.9   Temperature Source oral   Pulse Pulse 74   Pulse source per Dinamap   Respirations Respirations 18   Systolic BP Systolic BP 016   Diastolic BP (mmHg) Diastolic BP (mmHg) 66   Mean BP 82  BP Source Dinamap   Pulse Ox % Pulse Ox % 97   Pulse Ox Activity Level  At rest   Oxygen Delivery 2L     Impression 69 year old female with obesity, HTN, COPD, long smoking hx, CLL, s/p chemo (last in 2012), who comes in from home due to fever and also malaise. Cardiology was consulted for elevated cardiac enz (TNT 0.12). Recent admission for similar sx, spiking fevers.  A/P: 1)Chest pain No significant chest pain sx concerning for angina at this time. TNT 0.12 x 1, then all other cardiac enz have been normal. Could have been lab error, POC TNT?, CK and MB is normal. If real, she does have underlying anemia, dehydration which could have contributed to demand ischemia. Recent echo was essentially normal (two weeks ago) --No further workup at this time.  2) Fevers/malaise: Etiology unclear.  Viral versus lymphoma?  Adrenal insufficiency? Could check cortisol when off steroids No sign of bacteremia. Fungal? Yeast grown out of stool last admission.  3) COPD: long smoking hx Encouraged smoking cessation  4) Anemia: Anemia of chronic disease? Monitoring   Electronic Signatures: Ida Rogue (MD)  (Signed 11-Feb-13 21:19)  Authored: General Aspect/Present Illness, History and Physical Exam, Review of System, Past Medical History, Health Issues, Home Medications, Labs, EKG , Radiology, Allergies, Vital Signs/Nurse's Notes, Impression/Plan   Last Updated: 11-Feb-13 21:19 by Ida Rogue (MD)

## 2014-12-09 NOTE — Consult Note (Signed)
Chief Complaint:   Subjective/Chief Complaint Overall same. Still on high flow [place]. No clinical signs of active GI bleeding. H and H stable.   VITAL SIGNS/ANCILLARY NOTES: **Vital Signs.:   24-Jan-13 12:07   Pulse Pulse 72   Respirations Respirations 16   Systolic BP Systolic BP 088   Diastolic BP (mmHg) Diastolic BP (mmHg) 39   Mean BP 74   Pulse Ox % Pulse Ox % 93   Pulse Ox Heart Rate 72   Routine Hem:  24-Jan-13 03:40    WBC (CBC) 6.3   RBC (CBC) 2.58   Hemoglobin (CBC) 8.5   Hematocrit (CBC) 25.5   Platelet Count (CBC) 62   MCV 99   MCH 32.7   MCHC 33.2   RDW 14.0   Neutrophil % 66.6   Lymphocyte % 30.9   Monocyte % 2.1   Eosinophil % 0.0   Basophil % 0.4   Neutrophil # 4.2   Lymphocyte # 2.0   Monocyte # 0.1   Eosinophil # 0.0   Basophil # 0.0  Routine Chem:  24-Jan-13 03:40    Glucose, Serum 241   BUN 14   Creatinine (comp) 0.98   Sodium, Serum 141   Potassium, Serum 3.7   Chloride, Serum 104   CO2, Serum 25   Calcium (Total), Serum 8.6   Anion Gap 12   Osmolality (calc) 290   eGFR (African American) >60   eGFR (Non-African American) >60   Assessment/Plan:  Assessment/Plan:   Assessment Anemia. Stable. No signs of active GI bleed. Respiratory failyre. CLL.    Plan Will continue conservative management with IV PPI. Follow H and H. Too unstable for an EGD. Dr. Candace Cruise will follow her over the weekend and I will be back on Tuesday.   Electronic Signatures: Jill Side (MD)  (Signed 24-Jan-13 12:21)  Authored: Chief Complaint, VITAL SIGNS/ANCILLARY NOTES, Lab Results, Assessment/Plan   Last Updated: 24-Jan-13 12:21 by Jill Side (MD)
